# Patient Record
Sex: Female | Born: 1965
Health system: Southern US, Community
[De-identification: ages and names within clinical notes are randomized; demographics above are authoritative.]

## PROBLEM LIST (undated history)

## (undated) DIAGNOSIS — N83209 Unspecified ovarian cyst, unspecified side: Secondary | ICD-10-CM

## (undated) DIAGNOSIS — G4730 Sleep apnea, unspecified: Secondary | ICD-10-CM

## (undated) HISTORY — DX: Sleep apnea, unspecified: G47.30

## (undated) HISTORY — PX: ABDOMINAL HYSTERECTOMY: SHX81

## (undated) HISTORY — PX: FRACTURE SURGERY: SHX138

---

## 1998-11-20 ENCOUNTER — Other Ambulatory Visit: Admission: RE | Admit: 1998-11-20 | Discharge: 1998-11-20 | Payer: Self-pay | Admitting: Obstetrics & Gynecology

## 1999-10-29 ENCOUNTER — Other Ambulatory Visit: Admission: RE | Admit: 1999-10-29 | Discharge: 1999-10-29 | Payer: Self-pay | Admitting: Obstetrics & Gynecology

## 2000-05-27 ENCOUNTER — Inpatient Hospital Stay (HOSPITAL_COMMUNITY): Admission: AD | Admit: 2000-05-27 | Discharge: 2000-05-29 | Payer: Self-pay | Admitting: Obstetrics & Gynecology

## 2000-06-02 ENCOUNTER — Encounter: Admission: RE | Admit: 2000-06-02 | Discharge: 2000-08-31 | Payer: Self-pay | Admitting: Obstetrics & Gynecology

## 2000-07-13 ENCOUNTER — Other Ambulatory Visit: Admission: RE | Admit: 2000-07-13 | Discharge: 2000-07-13 | Payer: Self-pay | Admitting: Obstetrics & Gynecology

## 2001-07-28 ENCOUNTER — Other Ambulatory Visit: Admission: RE | Admit: 2001-07-28 | Discharge: 2001-07-28 | Payer: Self-pay | Admitting: Obstetrics & Gynecology

## 2002-05-11 ENCOUNTER — Ambulatory Visit (HOSPITAL_COMMUNITY): Admission: RE | Admit: 2002-05-11 | Discharge: 2002-05-11 | Payer: Self-pay | Admitting: Obstetrics & Gynecology

## 2002-05-11 ENCOUNTER — Encounter: Payer: Self-pay | Admitting: Obstetrics & Gynecology

## 2002-06-05 ENCOUNTER — Inpatient Hospital Stay (HOSPITAL_COMMUNITY): Admission: AD | Admit: 2002-06-05 | Discharge: 2002-06-08 | Payer: Self-pay | Admitting: Obstetrics & Gynecology

## 2002-06-10 ENCOUNTER — Encounter: Admission: RE | Admit: 2002-06-10 | Discharge: 2002-07-10 | Payer: Self-pay | Admitting: Obstetrics & Gynecology

## 2002-07-20 ENCOUNTER — Other Ambulatory Visit: Admission: RE | Admit: 2002-07-20 | Discharge: 2002-07-20 | Payer: Self-pay | Admitting: Obstetrics & Gynecology

## 2003-08-29 ENCOUNTER — Other Ambulatory Visit: Admission: RE | Admit: 2003-08-29 | Discharge: 2003-08-29 | Payer: Self-pay | Admitting: Obstetrics & Gynecology

## 2003-12-17 ENCOUNTER — Other Ambulatory Visit: Admission: RE | Admit: 2003-12-17 | Discharge: 2003-12-17 | Payer: Self-pay | Admitting: Obstetrics & Gynecology

## 2006-02-24 ENCOUNTER — Other Ambulatory Visit: Admission: RE | Admit: 2006-02-24 | Discharge: 2006-02-24 | Payer: Self-pay | Admitting: Obstetrics & Gynecology

## 2011-01-23 ENCOUNTER — Encounter: Payer: Self-pay | Admitting: Sports Medicine

## 2011-01-23 ENCOUNTER — Ambulatory Visit (INDEPENDENT_AMBULATORY_CARE_PROVIDER_SITE_OTHER): Payer: 59 | Admitting: Sports Medicine

## 2011-01-23 DIAGNOSIS — M25569 Pain in unspecified knee: Secondary | ICD-10-CM

## 2011-01-23 DIAGNOSIS — Q667 Congenital pes cavus, unspecified foot: Secondary | ICD-10-CM | POA: Insufficient documentation

## 2011-01-23 DIAGNOSIS — M21619 Bunion of unspecified foot: Secondary | ICD-10-CM

## 2011-01-28 NOTE — Assessment & Plan Note (Signed)
SummaryVirgina Organ RT [facility] [phone]   Vital Signs:  Patient profile:   45 year old female Height:      67 inches Weight:      169 pounds BMI:     26.56 BP sitting:   122 / 80  Vitals Entered By: Lillia Pauls CMA (January 23, 2011 2:10 PM) CC: rt knee pain since 08/2010   CC:  rt knee pain since 08/2010.  History of Present Illness: Pt presents to clinic for evaluation of rt anterior knee pain that she has experienced for the most part of the past yr.   Saw Eulah Pont and Thurston Hole 08/2010 dx with chondromylacia on rt knee by MRI.  Has pain with prolonged bending of knee with seated position, running, and tennis.  Has seen [facility] Distance had e stimulation for swelling- this was helpful.  Plays tennis 4 times per week, and runs 3 times per week.  no locking or giving way has been getting swelling w running and sports  bunions for a long time but no TX for these and they are not painful     Preventive Screening-Counseling & Management  Alcohol-Tobacco     Smoking Status: never  Social History: Smoking Status:  never  Physical Exam  General:  Well-developed,well-nourished,in no acute distress; alert,appropriate and cooperative throughout examination Msk:  Bilat crepitation R>L Leg lengths equal Full flexion and extension rt knee, nl rotation, ligaments stable Full flexion and extension lt knee, nl rotation, ligaments stable  rt quad weaker than lt  Good hip abduction rt and lt  Bilat bunions High arch bilat Good posterior tib function    Impression & Recommendations:  Problem # 1:  KNEE PAIN, RIGHT (ICD-719.46) she does appear to have significant chondromalacia note MRI showed significant cartilage loss recurrent swelling significant crepitation  given patellar tendon strap begin HEP ice for eswelling  more consistent NSAID use add some biking  reck 1 mo  Problem # 2:  BUNION (ICD-727.1) I think she needs custom orthotics  make these on RTC and before  nay running or tennis  Problem # 3:  TALIPES CAVUS (ICD-754.71) needs good arch support in all shoes  Patient Instructions: 1)  Wear knee strap with running, ok to wear brace with tennis  2)  biking 2 to 3 times per week - not heavy gears/ not too many hills 3)  OK to do ellitiptical on other days for workout 4)  start daily quad exercises  5)  Isometric 6)  straight leg lift 7)  lying leg extension on towel 8)  work up to 3 x 30 9)  then we will add 2 lb ankle weight and work up to 3 sets of 15 10)  ice toward end of day 11)  aleve 1 or 2 daily   Orders Added: 1)  New Patient Level III [99203]  Appended Document: NP,RUNNERS RT [facility] [phone]

## 2014-11-17 HISTORY — PX: OTHER SURGICAL HISTORY: SHX169

## 2015-02-26 HISTORY — PX: WRIST FRACTURE SURGERY: SHX121

## 2015-08-01 ENCOUNTER — Other Ambulatory Visit: Payer: Self-pay | Admitting: Obstetrics & Gynecology

## 2015-08-01 DIAGNOSIS — N926 Irregular menstruation, unspecified: Secondary | ICD-10-CM

## 2015-08-02 ENCOUNTER — Ambulatory Visit
Admission: RE | Admit: 2015-08-02 | Discharge: 2015-08-02 | Disposition: A | Discharge status: Home / Self Care | Payer: BLUE CROSS/BLUE SHIELD | Source: Ambulatory Visit | Attending: Obstetrics & Gynecology | Admitting: Obstetrics & Gynecology

## 2015-08-02 DIAGNOSIS — N926 Irregular menstruation, unspecified: Secondary | ICD-10-CM

## 2015-08-02 MED ORDER — IOPAMIDOL (ISOVUE-300) INJECTION 61%
100.0000 mL | Freq: Once | INTRAVENOUS | Status: AC | PRN
  Administered 2015-08-02: 100 mL via INTRAVENOUS

## 2015-08-10 ENCOUNTER — Ambulatory Visit: Payer: BLUE CROSS/BLUE SHIELD | Attending: Gynecologic Oncology | Admitting: Gynecologic Oncology

## 2015-08-10 ENCOUNTER — Encounter: Payer: Self-pay | Admitting: Gynecologic Oncology

## 2015-08-10 VITALS — BP 118/80 | HR 88 | Temp 99.2°F | Resp 18 | Ht 67.0 in | Wt 238.5 lb

## 2015-08-10 DIAGNOSIS — N8320 Unspecified ovarian cysts: Secondary | ICD-10-CM | POA: Diagnosis not present

## 2015-08-10 DIAGNOSIS — N939 Abnormal uterine and vaginal bleeding, unspecified: Secondary | ICD-10-CM | POA: Diagnosis not present

## 2015-08-10 DIAGNOSIS — N930 Postcoital and contact bleeding: Secondary | ICD-10-CM

## 2015-08-10 DIAGNOSIS — N83201 Unspecified ovarian cyst, right side: Secondary | ICD-10-CM

## 2015-08-10 NOTE — Patient Instructions (Signed)
Preparing for your Surgery  Plan for surgery on October 18 with Dr. Everitt Evangelyne.  Pre-operative Testing -You will receive a phone call from presurgical testing at [facility] to arrange for a pre-operative testing appointment before your surgery.  This appointment normally occurs one to two weeks before your scheduled surgery.   -Bring your insurance card, copy of an advanced directive if applicable, medication list  -At that visit, you will be asked to sign a consent for a possible blood transfusion in case a transfusion becomes necessary during surgery.  The need for a blood transfusion is rare but having consent is a necessary part of your care.     -You should not be taking blood thinners or aspirin at least ten days prior to surgery unless instructed by your surgeon.  Day Before Surgery at [place] will be asked to take in only clear liquids the day before surgery.  Examples of clear liquids include broths, jello, and clear juices.  Avoid carbonated beverages.  You will be advised to have nothing to eat or drink after midnight the evening before.    Your role in recovery Your role is to become active as soon as directed by your doctor, while still giving yourself time to heal.  Rest when you feel tired. You will be asked to do the following in order to speed your recovery:  - Cough and breathe deeply. This helps toclear and expand your lungs and can prevent pneumonia. You may be given a spirometer to practice deep breathing. A staff member will show you how to use the spirometer. - Do mild physical activity. Walking or moving your legs help your circulation and body functions return to normal. A staff member will help you when you try to walk and will provide you with simple exercises. Do not try to get up or walk alone the first time. - Actively manage your pain. Managing your pain lets you move in comfort. We will ask you to rate your pain on a scale of zero to 10.  It is your responsibility to tell your doctor or nurse where and how much you hurt so your pain can be treated.  Special Considerations -If you are diabetic, you may be placed on insulin after surgery to have closer control over your blood sugars to promote healing and recovery.  This does not mean that you will be discharged on insulin.  If applicable, your oral antidiabetics will be resumed when you are tolerating a solid diet.  -Your final pathology results from surgery should be available by the Friday after surgery and the results will be relayed to you when available.  Blood Transfusion Information WHAT IS A BLOOD TRANSFUSION? A transfusion is the replacement of blood or some of its parts. Blood is made up of multiple cells which provide different functions.  Red blood cells carry oxygen and are used for blood loss replacement.  White blood cells fight against infection.  Platelets control bleeding.  Plasma helps clot blood.  Other blood products are available for specialized needs, such as hemophilia or other clotting disorders. BEFORE THE TRANSFUSION  Who gives blood for transfusions?   You may be able to donate blood to be used at a later date on yourself (autologous donation).  Relatives can be asked to donate blood. This is generally not any safer than if you have received blood from a stranger. The same precautions are taken to ensure safety when a relative's blood is donated.  Healthy volunteers who are fully evaluated to make sure their blood is safe. This is blood bank blood. Transfusion therapy is the safest it has ever been in the practice of medicine. Before blood is taken from a donor, a complete history is taken to make sure that person has no history of diseases nor engages in risky social behavior (examples are intravenous drug use or sexual activity with multiple partners). The donor's travel history is screened to minimize risk of transmitting infections, such as  malaria. The donated blood is tested for signs of infectious diseases, such as HIV and hepatitis. The blood is then tested to be sure it is compatible with you in order to minimize the chance of a transfusion reaction. If you or a relative donates blood, this is often done in anticipation of surgery and is not appropriate for emergency situations. It takes many days to process the donated blood. RISKS AND COMPLICATIONS Although transfusion therapy is very safe and saves many lives, the main dangers of transfusion include:   Getting an infectious disease.  Developing a transfusion reaction. This is an allergic reaction to something in the blood you were given. Every precaution is taken to prevent this. The decision to have a blood transfusion has been considered carefully by your caregiver before blood is given. Blood is not given unless the benefits outweigh the risks.

## 2015-08-10 NOTE — Progress Notes (Signed)
Consult Note: Gyn-Onc  Consult was requested by Dr. Nori Riis for the evaluation of Jenna Pierce 49 y.o. female with a 4cm complex right adnexal mass, enlarged uterus and abnormal uterine bleeding.  CC:  Chief Complaint  Patient presents with  . right ovarian cyst    New consult    Assessment/Plan:  Jenna Pierce  is a 49 y.o.  year old obese woman with an enlarged (11cm) fibroid uterus with abnormal uterine bleeding refractory to medical therapy and a 4cm right ovarian complex cyst with papillary features and increased flow. Her CA 125 is normal at 21. I discussed with Jessic that this mass is most likely a benign or borderline process, however, I recommend its surgical removal and testing with frozen section. If malignancy is identified, she would require a complete hysterectomy, BSO and staging. I discussed that if it is a benign mass, we would leave her normal appearing left ovary, but remove its tube. We discussed hysterectomy. She would prefer that we do this with her oophorectomy as she is not interested in trying again an IUD or trying endometrial ablation. She understands that hysterectomy adds increased risk and recovery to the procedure and she may require a minilaparotomy for specimen delivery.  I discussed surgical risks including  bleeding, infection, damage to internal organs (such as bladder,ureters, bowels), blood clot, reoperation and rehospitalization. I discussed that I though that she was a good candidate for a robotic assisted TLH, RSO and frozen section. I discussed that her obesity places her at increased risks for complications. We discussed that hysterectomy would result in permanent infertility.  Followup today's biopsy regarding endometrial pathololgy   HPI: Jenna Pierce is a 49 year old woman who is seen in consultation at the request of Dr Nori Riis for a complex right ovarian cyst. The patient has been struggling with symptoms of abnormal uterine bleeding for several years. She  has tried IUD. As part of her workup to a potential hysterctomy, she received a TV US  On 08/01/15 Which showed a 4.4c3.3c3.2cm ovarian mass with papillary projectsion that have blood flow within them. Endometrial stripe was 59mm. The CT abdo/pelvis on 08/02/15 showed no evidence of metastatic disease or ascites.  CA 125 was drawn on 08/01/15 and was normal at 21.  She has had 2 prior SVD's and no prior surgeries on her abdomen.   Current Meds:  Outpatient Encounter Prescriptions as of 08/10/2015  Medication Sig  . ALPRAZolam (XANAX) 0.5 MG tablet TAKE 1 TABLET 3 TIMES A DAY AS NEEDED   No facility-administered encounter medications on file as of 08/10/2015.    Allergy: No Known Allergies  Social Hx:   Social History   Social History  . Marital Status: Single    Spouse Name: N/A  . Number of Children: N/A  . Years of Education: N/A   Occupational History  . Not on file.   Social History Main Topics  . Smoking status: Never Smoker   . Smokeless tobacco: Not on file  . Alcohol Use: Yes     Comment: 1 drink daily with dinner  . Drug Use: No  . Sexual Activity: Not on file   Other Topics Concern  . Not on file   Social History Narrative  . No narrative on file    Past Surgical Hx:  Past Surgical History  Procedure Laterality Date  . Wrist fracture surgery Left 02/26/2015    hardware and screws placed    Past Medical Hx: History reviewed.  No pertinent past medical history.  Past Gynecological History:   Patient's last menstrual period was 07/30/2015.  Family Hx:  Family History  Problem Relation Age of Onset  . Diabetes Father   . Breast cancer Paternal Grandmother     Review of Systems:  Constitutional  Feels well,    ENT Normal appearing ears and nares bilaterally Skin/Breast  No rash, sores, jaundice, itching, dryness Cardiovascular  No chest pain, shortness of breath, or edema  Pulmonary  No cough or wheeze.  Gastro Intestinal  No nausea, vomitting,  or diarrhoea. No bright red blood per rectum, no abdominal pain, change in bowel movement, or constipation.  Genito Urinary  No frequency, urgency, dysuria, see HPI Musculo Skeletal  No myalgia, arthralgia, joint swelling or pain  Neurologic  No weakness, numbness, change in gait,  Psychology  No depression, anxiety, insomnia.   Vitals:  Blood pressure 118/80, pulse 88, temperature 99.2 F (37.3 C), temperature source Oral, resp. rate 18, height 5\' 7"  (1.702 m), weight 238 lb 8 oz (108.183 kg), last menstrual period 07/30/2015, SpO2 99 %.  Physical Exam: WD in NAD Neck  Supple NROM, without any enlargements.  Lymph Node Survey No cervical supraclavicular or inguinal adenopathy Cardiovascular  Pulse normal rate, regularity and rhythm. S1 and S2 normal.  Lungs  Clear to auscultation bilateraly, without wheezes/crackles/rhonchi. Good air movement.  Skin  No rash/lesions/breakdown  Psychiatry  Alert and oriented to person, place, and time  Abdomen  Normoactive bowel sounds, abdomen soft, non-tender and obese without evidence of hernia.  Back No CVA tenderness Genito Urinary  Vulva/vagina: Normal external female genitalia.  No lesions. No discharge or bleeding.  Bladder/urethra:  No lesions or masses, well supported bladder  Vagina: normal  Cervix: Normal appearing, no lesions.  Uterus:  Bulky, 10 week size, mobile, no parametrial involvement or nodularity.  Adnexa: no palpable masses.  PROCEDURE NOTE:  Endometrial Biopsy  The procedure was explained.  The speculum was placed and the cervix was prepped with betadine.  The endometrial biopsy was performed with 2 passes of the endometrial biopsy pipelle. scant tissue was obtained. The uterus sounded to 11 cm.  The patient tolerated procedure    Rectal  Good tone, no masses no cul de sac nodularity.  Extremities  No bilateral cyanosis, clubbing or edema.   [name], MD  08/10/2015, 6:06 PM

## 2015-08-14 ENCOUNTER — Telehealth: Payer: Self-pay | Admitting: *Deleted

## 2015-08-14 NOTE — Telephone Encounter (Signed)
Per patient request, she would like to have surgery at [facility] sooner. Pt scheduled 08/24/15 at [facility] with Dr. Alycia Rossetti. Records faxed to [facility] and she is agreeable to call patient with pre-op appt. Called and notified patient of surgery date and that she will receive phone call from [facility]. No other questions or concerns noted at this time.

## 2015-08-14 NOTE — Telephone Encounter (Signed)
Per Joylene John, NP patient notified that endometrial biopsy did not show any cancer. Patient states understanding and no other concerns noted at this time.

## 2015-08-20 ENCOUNTER — Emergency Department (HOSPITAL_COMMUNITY)
Admission: EM | Admit: 2015-08-20 | Discharge: 2015-08-20 | Disposition: A | Discharge status: Home / Self Care | Payer: BLUE CROSS/BLUE SHIELD | Attending: Emergency Medicine | Admitting: Emergency Medicine

## 2015-08-20 ENCOUNTER — Encounter (HOSPITAL_COMMUNITY): Payer: Self-pay | Admitting: Emergency Medicine

## 2015-08-20 DIAGNOSIS — R109 Unspecified abdominal pain: Secondary | ICD-10-CM

## 2015-08-20 DIAGNOSIS — Z8742 Personal history of other diseases of the female genital tract: Secondary | ICD-10-CM | POA: Diagnosis not present

## 2015-08-20 DIAGNOSIS — R1032 Left lower quadrant pain: Secondary | ICD-10-CM | POA: Diagnosis not present

## 2015-08-20 DIAGNOSIS — R1030 Lower abdominal pain, unspecified: Secondary | ICD-10-CM | POA: Diagnosis not present

## 2015-08-20 DIAGNOSIS — Z3202 Encounter for pregnancy test, result negative: Secondary | ICD-10-CM | POA: Insufficient documentation

## 2015-08-20 DIAGNOSIS — R1031 Right lower quadrant pain: Secondary | ICD-10-CM | POA: Diagnosis not present

## 2015-08-20 DIAGNOSIS — R1033 Periumbilical pain: Secondary | ICD-10-CM | POA: Insufficient documentation

## 2015-08-20 HISTORY — DX: Unspecified ovarian cyst, unspecified side: N83.209

## 2015-08-20 LAB — CBC
HCT: 36.6 % (ref 36.0–46.0)
HEMOGLOBIN: 12.4 g/dL (ref 12.0–15.0)
MCH: 30.1 pg (ref 26.0–34.0)
MCHC: 33.9 g/dL (ref 30.0–36.0)
MCV: 88.8 fL (ref 78.0–100.0)
PLATELETS: 238 10*3/uL (ref 150–400)
RBC: 4.12 MIL/uL (ref 3.87–5.11)
RDW: 12.8 % (ref 11.5–15.5)
WBC: 12.9 10*3/uL — ABNORMAL HIGH (ref 4.0–10.5)

## 2015-08-20 LAB — COMPREHENSIVE METABOLIC PANEL
ALK PHOS: 78 U/L (ref 38–126)
ALT: 14 U/L (ref 14–54)
ANION GAP: 8 (ref 5–15)
AST: 22 U/L (ref 15–41)
Albumin: 3.9 g/dL (ref 3.5–5.0)
BILIRUBIN TOTAL: 0.5 mg/dL (ref 0.3–1.2)
BUN: 11 mg/dL (ref 6–20)
CALCIUM: 9.8 mg/dL (ref 8.9–10.3)
CO2: 24 mmol/L (ref 22–32)
Chloride: 105 mmol/L (ref 101–111)
Creatinine, Ser: 0.77 mg/dL (ref 0.44–1.00)
Glucose, Bld: 123 mg/dL — ABNORMAL HIGH (ref 65–99)
Potassium: 3.6 mmol/L (ref 3.5–5.1)
SODIUM: 137 mmol/L (ref 135–145)
TOTAL PROTEIN: 7.4 g/dL (ref 6.5–8.1)

## 2015-08-20 LAB — URINALYSIS, ROUTINE W REFLEX MICROSCOPIC
Bilirubin Urine: NEGATIVE
GLUCOSE, UA: NEGATIVE mg/dL
KETONES UR: NEGATIVE mg/dL
LEUKOCYTES UA: NEGATIVE
Nitrite: NEGATIVE
PROTEIN: NEGATIVE mg/dL
Specific Gravity, Urine: 1.01 (ref 1.005–1.030)
UROBILINOGEN UA: 0.2 mg/dL (ref 0.0–1.0)
pH: 5.5 (ref 5.0–8.0)

## 2015-08-20 LAB — URINE MICROSCOPIC-ADD ON

## 2015-08-20 LAB — POC URINE PREG, ED: PREG TEST UR: NEGATIVE

## 2015-08-20 LAB — LIPASE, BLOOD: Lipase: 28 U/L (ref 22–51)

## 2015-08-20 MED ORDER — MORPHINE SULFATE (PF) 4 MG/ML IV SOLN
4.0000 mg | Freq: Once | INTRAVENOUS | Status: AC
  Administered 2015-08-20: 4 mg via INTRAVENOUS
  Filled 2015-08-20: qty 1

## 2015-08-20 MED ORDER — SODIUM CHLORIDE 0.9 % IV BOLUS (SEPSIS)
1000.0000 mL | Freq: Once | INTRAVENOUS | Status: AC
  Administered 2015-08-20: 1000 mL via INTRAVENOUS

## 2015-08-20 MED ORDER — ONDANSETRON HCL 4 MG/2ML IJ SOLN
4.0000 mg | Freq: Once | INTRAMUSCULAR | Status: AC
  Administered 2015-08-20: 4 mg via INTRAVENOUS
  Filled 2015-08-20: qty 2

## 2015-08-20 MED ORDER — OXYCODONE-ACETAMINOPHEN 5-325 MG PO TABS: 1.0000 | ORAL_TABLET | ORAL | Status: DC | PRN

## 2015-08-20 MED ORDER — ONDANSETRON 4 MG PO TBDP: 4.0000 mg | ORAL_TABLET | Freq: Three times a day (TID) | ORAL | Status: DC | PRN

## 2015-08-20 NOTE — ED Provider Notes (Signed)
CSN: [id number]     Arrival date & time 08/20/15  0006 History   First MD Initiated Contact with Patient 08/20/15 0036     Chief Complaint  Patient presents with  . Abdominal Pain  . Ovarian Cyst     (Consider location/radiation/quality/duration/timing/severity/associated sxs/prior Treatment) Patient is a 49 y.o. female presenting with abdominal pain. The history is provided by the patient and medical records.  Abdominal Pain  49 year old female with history of ovarian cyst and uterine fibroids, presenting to the ED for abdominal pain. Patient is currently seeing GYN oncology for mass of her right ovary as well as fibroid uterus. She has had recent ultrasound and CT scan which are largely unchanged from prior.  She also recently underwent endometrial bx which was negative for cancer.  She is scheduled for right nephrectomy on 08/24/2015. States she has pain at baseline, however became worse last night. Of note, she also started her menstrual cycle yesterday.  Pain described as cramping throughout her lower abdomen.  She denies any nausea, vomiting, or diarrhea. No urinary symptoms or vaginal discharge.  She admits to decreased oral intake due to pain. She reports low-grade fever earlier today of 100F noted on temp oral thermometer, patient afebrile here. She is taken Tylenol prior to arrival with some relief of pain.  Past Medical History  Diagnosis Date  . Ovarian cyst    Past Surgical History  Procedure Laterality Date  . Wrist fracture surgery Left 02/26/2015    hardware and screws placed   Family History  Problem Relation Age of Onset  . Diabetes Father   . Breast cancer Paternal Grandmother    Social History  Substance Use Topics  . Smoking status: Never Smoker   . Smokeless tobacco: None  . Alcohol Use: Yes     Comment: 1 drink daily with dinner   OB History    No data available     Review of Systems  Gastrointestinal: Positive for abdominal pain.  All other systems  reviewed and are negative.     Allergies  Review of patient's allergies indicates no known allergies.  Home Medications   Prior to Admission medications   Medication Sig Start Date End Date Taking? Authorizing Provider  ALPRAZolam (XANAX) 0.5 MG tablet TAKE 1 TABLET 3 TIMES A DAY AS NEEDED for anxiety 08/06/15  Yes Historical Provider, MD   BP 161/95 mmHg  Pulse 98  Temp(Src) 98.5 F (36.9 C) (Oral)  Resp 16  Ht 5\' 7"  (1.702 m)  Wt 238 lb (107.956 kg)  BMI 37.27 kg/m2  SpO2 98%  LMP 08/19/2015 (Exact Date)   Physical Exam  Constitutional: She is oriented to person, place, and time. She appears well-developed and well-nourished.  HENT:  Head: Normocephalic and atraumatic.  Mouth/Throat: Oropharynx is clear and moist.  Eyes: Conjunctivae and EOM are normal. Pupils are equal, round, and reactive to light.  Neck: Normal range of motion. Neck supple.  Cardiovascular: Normal rate, regular rhythm and normal heart sounds.   Pulmonary/Chest: Effort normal and breath sounds normal. No respiratory distress. She has no wheezes.  Abdominal: Soft. Bowel sounds are normal. There is tenderness in the right lower quadrant, periumbilical area, suprapubic area and left lower quadrant. There is no rigidity, no guarding, no CVA tenderness, no tenderness at McBurney's point and negative Murphy's sign.  Mild tenderness of lower abdomen and periumbilical region with endorsed cramping sensation, normal bowel sounds, no peritonitis  Musculoskeletal: Normal range of motion.  Neurological: She is alert and  oriented to person, place, and time.  Skin: Skin is warm and dry.  Psychiatric: She has a normal mood and affect.  Nursing note and vitals reviewed.   ED Course  Procedures (including critical care time) Labs Review Labs Reviewed  COMPREHENSIVE METABOLIC PANEL - Abnormal; Notable for the following:    Glucose, Bld 123 (*)    All other components within normal limits  CBC - Abnormal; Notable  for the following:    WBC 12.9 (*)    All other components within normal limits  URINALYSIS, ROUTINE W REFLEX MICROSCOPIC (NOT AT [facility]) - Abnormal; Notable for the following:    APPearance CLOUDY (*)    Hgb urine dipstick LARGE (*)    All other components within normal limits  LIPASE, BLOOD  URINE MICROSCOPIC-ADD ON  POC URINE PREG, ED    Imaging Review No results found. I have personally reviewed and evaluated these images and lab results as part of my medical decision-making.   EKG Interpretation None      MDM   Final diagnoses:  Abdominal pain, unspecified abdominal location   49 year old female here with abdominal pain. Long-standing history of ovarian cysts and is scheduled for surgery on 08/24/2015 for this. Patient reports fever at home by temporal thermometer, afebrile here.  Has some tenderness across lower abdomen and periumbilical region without peritonitis. Labwork was obtained, slight leukocytosis noted-- no prior values available for comparison. UA is noninfectious-- blood noted which is likely due to menstrual. Patient was treated with IV fluids, morphine, and Zofran with significant improvement of her pain.  She may have increased pain secondary to onset of menses yesterday.  Patient has had recent CT scan and transvaginal ultrasound within the past 2 weeks as well as recently endometrial bx.  Will hold off on repeat imaging at this time.  Patient has previously scheduled follow-up tomorrow with OB-GYN in [place] for pre-op appt, recommended to discuss ED visit with physician.  Rx percocet, zofran.  Discussed plan with patient, he/she acknowledged understanding and agreed with plan of care.  Return precautions given for new or worsening symptoms.  Larene Pickett, PA-C 08/20/15 [id number]  April Palumbo, MD 08/20/15 [phone]

## 2015-08-20 NOTE — ED Notes (Signed)
Patient is resting comfortably. 

## 2015-08-20 NOTE — ED Notes (Signed)
Pt presents from home c/o lower abdominal pain rated 7/10 due to ovarian cysts.  She is scheduled for removal of her right ovary at the end of the week.  She has had numerous diagnostic tests performed and after the ovarian removal they will be doing a biopsy to evaluate for potential CA.  She started her period last night. Fever 100F at home, 98.5 in triage after taking 2 tylenol prior to arrival in ED. No acute distress.

## 2015-08-20 NOTE — Discharge Instructions (Signed)
Take the prescribed medication as directed. Follow-up at pre-op appt tomorrow at [facility]. Return to the ED for new or worsening symptoms.

## 2015-08-24 HISTORY — PX: ROBOTIC ASSISTED TOTAL HYSTERECTOMY WITH BILATERAL SALPINGO OOPHERECTOMY: SHX6086

## 2015-08-30 ENCOUNTER — Telehealth: Payer: Self-pay | Admitting: *Deleted

## 2015-08-30 NOTE — Telephone Encounter (Signed)
Received phone call from patient requesting to schedule post-op appt in [place]. Patient had surgery at [facility] on 08/24/15 with Dr. Alycia Rossetti. Patient scheduled for 10-31 with Dr. Denman George (as Dr. Elenora Gamma next clinic day is 10/17/15). Patient denies any post-op concerns at this time. Instructed patient to call our office with any additional questions or concerns prior to scheduled appt.

## 2015-09-17 ENCOUNTER — Encounter: Payer: Self-pay | Admitting: Gynecologic Oncology

## 2015-09-17 ENCOUNTER — Ambulatory Visit: Payer: BLUE CROSS/BLUE SHIELD | Attending: Gynecologic Oncology | Admitting: Gynecologic Oncology

## 2015-09-17 VITALS — BP 129/87 | HR 79 | Temp 98.0°F | Resp 20 | Ht 67.0 in | Wt 233.2 lb

## 2015-09-17 DIAGNOSIS — N809 Endometriosis, unspecified: Secondary | ICD-10-CM

## 2015-09-17 DIAGNOSIS — N939 Abnormal uterine and vaginal bleeding, unspecified: Secondary | ICD-10-CM | POA: Diagnosis not present

## 2015-09-17 DIAGNOSIS — Z9889 Other specified postprocedural states: Secondary | ICD-10-CM | POA: Diagnosis not present

## 2015-09-17 DIAGNOSIS — N83201 Unspecified ovarian cyst, right side: Secondary | ICD-10-CM | POA: Diagnosis not present

## 2015-09-17 DIAGNOSIS — N800 Endometriosis of uterus: Secondary | ICD-10-CM

## 2015-09-17 NOTE — Patient Instructions (Signed)
Plan to follow up with your GYN for well woman care annually.  Please call the office for any questions or concerns.  Hold off on having intercourse until the light pink discharge resolves.

## 2015-09-17 NOTE — Progress Notes (Signed)
POSTOPERATIVE FOLLOWUP  Assessment:    49 y.o. year old with a history of stage IV endometriosis and oophoritis.   S/p robotic assisted total hysterectomy, BSO and lysis of adhesions on 08/24/15.   Plan: 1) Pathology reports reviewed today 2) Treatment counseling - I discussed that she has benign disease and does not require specific followup for this.  I discussed that she should consider taking estrogen replacement therapy (0.025 q weekly transdermal) until age 68-51 or age of natural menopause for CV/bone health. She was given the opportunity to ask questions, which were answered to her satisfaction, and she is agreement with the above mentioned plan of care.  3) vaginal cuff bleeding - appears consistent with postop healing. Recommend no intercourse until no further bleeding or until 6 weeks.  4)  Return to clinic on a prn basis. I recommended she followup with Dr Nori Riis annually for well woman care.  HPI:  Jenna Pierce is a 50 y.o. year old No obstetric history on file. initially seen in consultation on 07/10/15 for a pelvic mass and abnormal uterine bleeding.  She then underwent a robotic assisted hysterectomy, BSO and lysis of adhesions with my partner, Dr Alycia Rossetti, on 08/24/15 at [facility] without complications, though she had findings of stage IV endometriosis, severe pelvic adhesive disease including obliteration of the posterior cul de sac and adhesions between colon and uterus.  Her postoperative course was uncomplicated.  Her final pathology revealed endometriosis, adenomyosis and signs of abscess and infection in the tubes and ovaries.  She is seen today for a postoperative check and to discuss her pathology results and ongoing plan.  Since discharge from the hospital, she is feeling well and increasingly well each day. She was prescribed 0.05mg  estradiol transdermal q weekly postop.  She has improving appetite, normal bowel and bladder function, and pain controlled with minimal PO  medication. She has no other complaints today.    Review of systems: Constitutional:  She has no weight gain or weight loss. She has no fever or chills. Eyes: No blurred vision Ears, Nose, Mouth, Throat: No dizziness, headaches or changes in hearing. No mouth sores. Gastrointestinal: She has normal bowel movements without diarrhea or constipation. She denies any nausea or vomiting. She denies blood in her stool or heart burn. Genitourinary:  She denies pelvic pain, pelvic pressure or changes in her urinary function. She has no hematuria, dysuria, or incontinence. She has no irregular vaginal bleeding or vaginal discharge Musculoskeletal: Denies muscle weakness or joint pains.  Skin:  She has no skin changes, rashes or itching Neurological:  Denies dizziness or headaches. No neuropathy, no numbness or tingling. Psychiatric:  She denies depression or anxiety. Hematologic/Lymphatic:   No easy bruising or bleeding   Physical Exam: Blood pressure 129/87, pulse 79, temperature 98 F (36.7 C), temperature source Oral, resp. rate 20, height 5\' 7"  (1.702 m), weight 233 lb 3.2 oz (105.779 kg), last menstrual period 08/19/2015, SpO2 99 %. General: Well dressed, well nourished in no apparent distress.   HEENT:  Normocephalic and atraumatic, no lesions.  Extraocular muscles intact. Sclerae anicteric. Pupils equal, round, reactive. No mouth sores or ulcers. Thyroid is normal size, not nodular, midline. Abdomen:  Soft, nontender, nondistended.  No palpable masses.  No hepatosplenomegaly.  No ascites. Normal bowel sounds.  No hernias.  Incisions are well healed. Genitourinary: Normal EGBUS  Vaginal cuff intact.  Scan blood tinged fluid in vagina, no discharge.  No cul de sac fullness. Extremities: No cyanosis, clubbing  or edema.  No calf tenderness or erythema. No palpable cords. Psychiatric: Mood and affect are appropriate. Neurological: Awake, alert and oriented x 3. Sensation is intact, no neuropathy.   Musculoskeletal: No pain, normal strength and range of motion.   [name], MD

## 2015-11-18 HISTORY — PX: BUNIONECTOMY: SHX129

## 2015-11-18 HISTORY — PX: OTHER SURGICAL HISTORY: SHX169

## 2016-02-21 DIAGNOSIS — M79642 Pain in left hand: Secondary | ICD-10-CM | POA: Diagnosis not present

## 2016-02-21 DIAGNOSIS — S62398D Other fracture of other metacarpal bone, subsequent encounter for fracture with routine healing: Secondary | ICD-10-CM | POA: Diagnosis not present

## 2016-03-05 DIAGNOSIS — S62398D Other fracture of other metacarpal bone, subsequent encounter for fracture with routine healing: Secondary | ICD-10-CM | POA: Diagnosis not present

## 2016-03-06 DIAGNOSIS — S62398D Other fracture of other metacarpal bone, subsequent encounter for fracture with routine healing: Secondary | ICD-10-CM | POA: Diagnosis not present

## 2016-03-06 DIAGNOSIS — M79642 Pain in left hand: Secondary | ICD-10-CM | POA: Diagnosis not present

## 2016-04-10 DIAGNOSIS — H5213 Myopia, bilateral: Secondary | ICD-10-CM | POA: Diagnosis not present

## 2016-06-25 DIAGNOSIS — D1801 Hemangioma of skin and subcutaneous tissue: Secondary | ICD-10-CM | POA: Diagnosis not present

## 2016-06-25 DIAGNOSIS — D485 Neoplasm of uncertain behavior of skin: Secondary | ICD-10-CM | POA: Diagnosis not present

## 2016-07-02 ENCOUNTER — Ambulatory Visit (INDEPENDENT_AMBULATORY_CARE_PROVIDER_SITE_OTHER): Payer: BLUE CROSS/BLUE SHIELD | Admitting: Podiatry

## 2016-07-02 ENCOUNTER — Ambulatory Visit (INDEPENDENT_AMBULATORY_CARE_PROVIDER_SITE_OTHER): Payer: BLUE CROSS/BLUE SHIELD

## 2016-07-02 ENCOUNTER — Encounter: Payer: Self-pay | Admitting: Podiatry

## 2016-07-02 VITALS — BP 131/89 | HR 81 | Resp 16 | Ht 67.0 in | Wt 225.0 lb

## 2016-07-02 DIAGNOSIS — M7662 Achilles tendinitis, left leg: Secondary | ICD-10-CM

## 2016-07-02 DIAGNOSIS — M21619 Bunion of unspecified foot: Secondary | ICD-10-CM

## 2016-07-02 DIAGNOSIS — M7661 Achilles tendinitis, right leg: Secondary | ICD-10-CM | POA: Diagnosis not present

## 2016-07-02 MED ORDER — DICLOFENAC SODIUM 75 MG PO TBEC: 75.0000 mg | DELAYED_RELEASE_TABLET | Freq: Two times a day (BID) | ORAL | 2 refills | Status: DC

## 2016-07-02 MED ORDER — TRIAMCINOLONE ACETONIDE 10 MG/ML IJ SUSP: 10.0000 mg | Freq: Once | INTRAMUSCULAR | Status: DC

## 2016-07-02 NOTE — Progress Notes (Signed)
Subjective:     Patient ID: Jenna Pierce, female   DOB: 1965-12-25, 50 y.o.   MRN: [id number]  HPI patient presents stating I have painful bunion deformities of both feet long-term nature and I'm having a lot of pain in the back of my heels right over left that has been getting worse gradually as time goes on for the last few months   Review of Systems  All other systems reviewed and are negative.      Objective:   Physical Exam  Constitutional: She is oriented to person, place, and time.  Cardiovascular: Intact distal pulses.   Musculoskeletal: Normal range of motion.  Neurological: She is oriented to person, place, and time.  Skin: Skin is warm.  Nursing note and vitals reviewed.  neurovascular status found to be intact with muscle strength adequate range of motion within normal limits with patient found to have large structural bunion deformities bilateral with redness and pain around the medial aspect first metatarsal head with patient noted to have inflammation and pain around the lateral portion of the Achilles tendon insertion heel right over left. Patient's found have good digital perfusion is well oriented 3 with moderate depression of the arch     Assessment:     Inflammatory tendinitis posterior lateral heel with long-term structural bunion history with significant family history of condition    Plan:     H&P and x-rays reviewed and careful injection right posterior heel administered 3 mg dexamethasone Kenalog 5 mg Xylocaine after first explaining chances for rupture. I then dispensed air fracture walker that I want her to wear as much as possible and I discussed bunion condition which will require aggressive Austin-type osteotomy and she will reappoint for consult for this when we get the heel was better  X-ray report indicated structural bunion deformity bilateral with elevation of the intermetatarsal angle of approximately 15 and deviation of the hallux bilateral

## 2016-07-02 NOTE — Progress Notes (Signed)
   Subjective:    Patient ID: Jenna Pierce, female    DOB: [birth year], 50 y.o.   MRN: [id number]  HPI Chief Complaint  Patient presents with  . Foot Pain    Bilateral; bunions & bumps on back of heels; pt stated, "Bunions hurt when wears shoes; bumps on back of heel are causing excruciating pain"; x2 months      Review of Systems  Musculoskeletal: Positive for arthralgias.  All other systems reviewed and are negative.      Objective:   Physical Exam        Assessment & Plan:

## 2016-07-02 NOTE — Patient Instructions (Signed)

## 2016-07-17 ENCOUNTER — Ambulatory Visit (INDEPENDENT_AMBULATORY_CARE_PROVIDER_SITE_OTHER): Payer: BLUE CROSS/BLUE SHIELD | Admitting: Podiatry

## 2016-07-17 DIAGNOSIS — M7662 Achilles tendinitis, left leg: Secondary | ICD-10-CM

## 2016-07-17 DIAGNOSIS — M21619 Bunion of unspecified foot: Secondary | ICD-10-CM

## 2016-07-17 DIAGNOSIS — M722 Plantar fascial fibromatosis: Secondary | ICD-10-CM

## 2016-07-17 DIAGNOSIS — M7661 Achilles tendinitis, right leg: Secondary | ICD-10-CM

## 2016-07-17 MED ORDER — TRIAMCINOLONE ACETONIDE 10 MG/ML IJ SUSP
10.0000 mg | Freq: Once | INTRAMUSCULAR | Status: AC
  Administered 2016-07-17: 10 mg

## 2016-07-17 NOTE — Progress Notes (Signed)
Subjective:     Patient ID: Jenna Pierce, female   DOB: [birth year], 50 y.o.   MRN: [id number]  HPI patient states they seem little better and the right one has improved but I still have the pain in the left one I like that treated and also I'm ready to get my bunions fixed   Review of Systems     Objective:   Physical Exam Neurovascular status intact muscle strength adequate with patient found to have large hyperostosis medial aspect first metatarsal head right over left with redness around the bone and inflammation and mild discomfort posterior aspect heel left over right    Assessment:     Achilles tendinitis left over right with the right one having done better with previous injection and structural bunion deformity bilateral    Plan:     H&P conditions reviewed and allowed her to review correction of bunion which be done right foot first and at this time I allowed her to go over consent form reviewing alternative treatments complications and the fact that recovery will be 6 months to one year. She wants surgery signed consent form and today I did a careful injection left lateral side after explaining chances for rupture with 3 mg Dexon some Kenalog 5 mill grams Xylocaine advised on reduced activity and utilization of boot on the left  X-rays reviewed today indicated elevation 1 to metatarsal angle of about 16 on both feet

## 2016-07-17 NOTE — Patient Instructions (Signed)

## 2016-08-28 ENCOUNTER — Telehealth: Payer: Self-pay | Admitting: *Deleted

## 2016-08-28 NOTE — Telephone Encounter (Signed)
"  Patient's [employer] has been terminated."  I will give her a call.   I attempted to call patient.  I left her a message to call me back.  I need to get updated insurance information for upcoming appointment on October 24.

## 2016-09-01 NOTE — Telephone Encounter (Signed)
I left a message for patient to give me a call.  Need to verify insurance.

## 2016-09-03 ENCOUNTER — Ambulatory Visit (INDEPENDENT_AMBULATORY_CARE_PROVIDER_SITE_OTHER): Payer: BLUE CROSS/BLUE SHIELD | Admitting: Podiatry

## 2016-09-03 ENCOUNTER — Encounter: Payer: Self-pay | Admitting: Podiatry

## 2016-09-03 DIAGNOSIS — M21619 Bunion of unspecified foot: Secondary | ICD-10-CM

## 2016-09-03 DIAGNOSIS — M7662 Achilles tendinitis, left leg: Secondary | ICD-10-CM

## 2016-09-03 DIAGNOSIS — M7661 Achilles tendinitis, right leg: Secondary | ICD-10-CM

## 2016-09-03 NOTE — Patient Instructions (Signed)

## 2016-09-03 NOTE — Progress Notes (Signed)
Subjective:     Patient ID: Jenna Pierce, female   DOB: 05/10/1966, 50 y.o.   MRN: [id number]  HPI patient presents stating that she is ready to get the right bunion fixed and would like to do the left one 3 weeks later if possible. Also complains about the Achilles tendon bothering her more on the left than the right   Review of Systems     Objective:   Physical Exam Neurovascular status intact muscle strength adequate range of motion within normal limits with patient found to have inflammatory changes posterior aspect left heel and large structural bunion deformities bilateral that are red and painful when pressed with deviation the hallux bilateral    Assessment:     Structural HAV deformity bilateral with Achilles tendinitis left over right    Plan:     H&P x-rays reviewed and recommended at this time structural bunion correction right to be followed by left and allow patient to read consent form reviewing alternative treatments and complications. Patient wants surgery signs consent form and we reviewed all complications as listed and alternative treatments and patient wished and recovery will take approximate 6 months. We also may consider injection of the posterior Achilles and I did explain risk also associated with this and patient wants this to possibly be performed with decision to be made at time of surgery

## 2016-09-04 NOTE — Telephone Encounter (Signed)
Patient updated her insurance information when she came in for consultation on yesterday.

## 2016-09-09 ENCOUNTER — Encounter: Payer: Self-pay | Admitting: Podiatry

## 2016-09-09 DIAGNOSIS — M2011 Hallux valgus (acquired), right foot: Secondary | ICD-10-CM | POA: Diagnosis not present

## 2016-09-09 DIAGNOSIS — M21611 Bunion of right foot: Secondary | ICD-10-CM | POA: Diagnosis not present

## 2016-09-09 DIAGNOSIS — Z01818 Encounter for other preprocedural examination: Secondary | ICD-10-CM | POA: Diagnosis not present

## 2016-09-09 DIAGNOSIS — M722 Plantar fascial fibromatosis: Secondary | ICD-10-CM | POA: Diagnosis not present

## 2016-09-12 NOTE — Progress Notes (Signed)
DOS 10.24.2017 Austin Bunionectomy (cutting and moving bone) with Pin Fixation Right; Possible Injection Heels B/L Achilles

## 2016-09-19 ENCOUNTER — Ambulatory Visit (INDEPENDENT_AMBULATORY_CARE_PROVIDER_SITE_OTHER): Payer: BLUE CROSS/BLUE SHIELD

## 2016-09-19 ENCOUNTER — Ambulatory Visit (INDEPENDENT_AMBULATORY_CARE_PROVIDER_SITE_OTHER): Payer: BLUE CROSS/BLUE SHIELD | Admitting: Podiatry

## 2016-09-19 ENCOUNTER — Encounter: Payer: Self-pay | Admitting: Podiatry

## 2016-09-19 VITALS — Temp 98.1°F

## 2016-09-19 DIAGNOSIS — M21619 Bunion of unspecified foot: Secondary | ICD-10-CM | POA: Diagnosis not present

## 2016-09-19 DIAGNOSIS — M7662 Achilles tendinitis, left leg: Secondary | ICD-10-CM | POA: Diagnosis not present

## 2016-09-19 DIAGNOSIS — Z9889 Other specified postprocedural states: Secondary | ICD-10-CM | POA: Diagnosis not present

## 2016-09-19 DIAGNOSIS — M7661 Achilles tendinitis, right leg: Secondary | ICD-10-CM | POA: Diagnosis not present

## 2016-09-19 DIAGNOSIS — Z09 Encounter for follow-up examination after completed treatment for conditions other than malignant neoplasm: Secondary | ICD-10-CM

## 2016-09-19 NOTE — Progress Notes (Signed)
This patient presents to the office approximately 10 days following surgery for correction of a severe bunion big toe joint, right foot. She states that she is experiencing significant pain or discomfort noted at the surgical site bone removal of the bandage. There was significant bleeding noted on the bandage itself. There is also significant swelling noted on the dorsum of the first metatarsal of the right foot. Patient states states that she has been more active than she should and has been not taking her pain medicine after the first 2 days due to a nauseous numbness issue. She presents the office today wearing her cam walker but her husband admits she has not been faithfully wearing her cam walker since the surgery. She presents the office today for her first postoperative visit   Neurovascular status intact. Good wound coaptation and sutures are intact at the first metatarsal right foot. There is significant swelling noted on the dorsum of the first metatarsal of the right foot. No signs of redness or infection or streaking noted at this time   S/p foot surgery   ROV  radiographic x-rays were studied and revealed good positioning of the capital fragment with the K wires intact. Examination of her foot does reveal significant swelling noted on the dorsum which causes her to have the throbbing and pain. Therefore, at this visit. We reapplied her bandage more loosely than  the surgical suite bandage.  We also discussed that she needs to take additional ibuprofen, which would help with both her pain and her swelling at the surgical site. I did not tell her to take additional pain meds since she had that nauseous numbness issue, she is to return the office in one week at which time she desires to discuss with Dr. Paulla Dolly, possibly postponing her next surgery until a later date   Gardiner Barefoot DPM

## 2016-09-25 ENCOUNTER — Ambulatory Visit (INDEPENDENT_AMBULATORY_CARE_PROVIDER_SITE_OTHER): Payer: BLUE CROSS/BLUE SHIELD | Admitting: Podiatry

## 2016-09-25 DIAGNOSIS — Z9889 Other specified postprocedural states: Secondary | ICD-10-CM

## 2016-09-25 DIAGNOSIS — M21619 Bunion of unspecified foot: Secondary | ICD-10-CM

## 2016-09-25 NOTE — Progress Notes (Signed)
Subjective:     Patient ID: Jenna Pierce, female   DOB: Apr 23, 1966, 50 y.o.   MRN: [id number]  HPI patient presents stating that she's doing better with her right foot and she wants to get the left one fixed in several weeks. States that she still has pain if she's on it too much but overall it has improved quite a bit   Review of Systems     Objective:   Physical Exam Neurovascular status intact negative Homan sign was noted with well coapted incision site right first metatarsal with mild edema around the first MPJ localized in nature. Range of motion is good with approximately 25 dorsiflexion 15 plantar flexion with no pain and there is significant structural bunion deformity of the left foot with mild to moderate hallux limitus deformity    Assessment:     Doing well post osteotomy first metatarsal right with continued moderate swelling which is normal for this process and structural bunion deformity with mild hallux limitus left    Plan:     Reviewed previous x-rays and at this point recommended plantar flexion of the hallux to stretch the capsule and dispensed a BioSkin brace in order to lower the big toe with a consistent brace tight. I then dispensed Ace wrap instructed on continued elevation boot usage surgical shoe and we discussed consent form for left foot consisting of correction of the left first metatarsal. Patient wants surgery which will be done in the next couple weeks and signs consent form understanding all possible complications as outlined and the fact that just is 1 foot is doing so well no guarantees the second one will. Understands total recovery from this condylar process to 6 months to one year

## 2016-09-25 NOTE — Patient Instructions (Signed)
Pre-Operative Instructions  Congratulations, you have decided to take an important step to improving your quality of life.  You can be assured that the doctors of [facility] will be with you every step of the way.  1. Plan to be at the surgery center/hospital at least 1 (one) hour prior to your scheduled time unless otherwise directed by the surgical center/hospital staff.  You must have a responsible adult accompany you, remain during the surgery and drive you home.  Make sure you have directions to the surgical center/hospital and know how to get there on time. 2. For hospital based surgery you will need to obtain a history and physical form from your family physician within 1 month prior to the date of surgery- we will give you a form for you primary physician.  3. We make every effort to accommodate the date you request for surgery.  There are however, times where surgery dates or times have to be moved.  We will contact you as soon as possible if a change in schedule is required.   4. No Aspirin/Ibuprofen for one week before surgery.  If you are on aspirin, any non-steroidal anti-inflammatory medications (Mobic, Aleve, Ibuprofen) you should stop taking it 7 days prior to your surgery.  You make take Tylenol  For pain prior to surgery.  5. Medications- If you are taking daily heart and blood pressure medications, seizure, reflux, allergy, asthma, anxiety, pain or diabetes medications, make sure the surgery center/hospital is aware before the day of surgery so they may notify you which medications to take or avoid the day of surgery. 6. No food or drink after midnight the night before surgery unless directed otherwise by surgical center/hospital staff. 7. No alcoholic beverages 24 hours prior to surgery.  No smoking 24 hours prior to or 24 hours after surgery. 8. Wear loose pants or shorts- loose enough to fit over bandages, boots, and casts. 9. No slip on shoes, sneakers are best. 10. Bring  your boot with you to the surgery center/hospital.  Also bring crutches or a walker if your physician has prescribed it for you.  If you do not have this equipment, it will be provided for you after surgery. 11. If you have not been contracted by the surgery center/hospital by the day before your surgery, call to confirm the date and time of your surgery. 12. Leave-time from work may vary depending on the type of surgery you have.  Appropriate arrangements should be made prior to surgery with your employer. 13. Prescriptions will be provided immediately following surgery by your doctor.  Have these filled as soon as possible after surgery and take the medication as directed. 14. Remove nail polish on the operative foot. 15. Wash the night before surgery.  The night before surgery wash the foot and leg well with the antibacterial soap provided and water paying special attention to beneath the toenails and in between the toes.  Rinse thoroughly with water and dry well with a towel.  Perform this wash unless told not to do so by your physician.  Enclosed: 1 Ice pack (please put in freezer the night before surgery)   1 Hibiclens skin cleaner   Pre-op Instructions  If you have any questions regarding the instructions, do not hesitate to call our office.  [place]: [address] [place], [place] [address] [phone]  [place]: [address] [place], [place] [address] [phone]  [place]: [address]  [place], [place] [address] [phone]   Dr.   Norman Regal DPM, Dr. Matthew Wagoner DPM, Dr. M. Todd Hyatt DPM, Dr. Titorya Stover DPM 

## 2016-10-03 ENCOUNTER — Telehealth: Payer: Self-pay | Admitting: *Deleted

## 2016-10-03 NOTE — Telephone Encounter (Signed)
"  I am calling to check on my surgery for next Tuesday November 21 with Dr. Paulla Dolly.  I have not heard from the surgical center yet.  I just want to follow-up to make sure you have me on the schedule.  Could you let me know?"

## 2016-10-07 ENCOUNTER — Encounter: Payer: Self-pay | Admitting: Podiatry

## 2016-10-07 DIAGNOSIS — M21612 Bunion of left foot: Secondary | ICD-10-CM | POA: Diagnosis not present

## 2016-10-07 DIAGNOSIS — M2012 Hallux valgus (acquired), left foot: Secondary | ICD-10-CM | POA: Diagnosis not present

## 2016-10-15 ENCOUNTER — Ambulatory Visit (INDEPENDENT_AMBULATORY_CARE_PROVIDER_SITE_OTHER): Payer: BLUE CROSS/BLUE SHIELD | Admitting: Podiatry

## 2016-10-15 ENCOUNTER — Ambulatory Visit (INDEPENDENT_AMBULATORY_CARE_PROVIDER_SITE_OTHER): Payer: BLUE CROSS/BLUE SHIELD

## 2016-10-15 ENCOUNTER — Encounter: Payer: Self-pay | Admitting: Podiatry

## 2016-10-15 VITALS — Temp 96.9°F

## 2016-10-15 DIAGNOSIS — M21619 Bunion of unspecified foot: Secondary | ICD-10-CM

## 2016-10-15 DIAGNOSIS — Z9889 Other specified postprocedural states: Secondary | ICD-10-CM

## 2016-10-15 MED ORDER — CEPHALEXIN 500 MG PO CAPS: 500.0000 mg | ORAL_CAPSULE | Freq: Three times a day (TID) | ORAL | 1 refills | Status: DC

## 2016-10-15 NOTE — Telephone Encounter (Signed)
Someone from the surgical center contacted the patient.

## 2016-10-16 NOTE — Progress Notes (Signed)
Subjective:     Patient ID: Jenna Pierce, female   DOB: 1966-11-03, 50 y.o.   MRN: [id number]  HPI patient presents for first visit for Austin-type osteotomy performed left and does admit that she has walked on her foot without her boot around the house and we did clean up her dressing last Friday without taking the underlying dressing off. Admits she's been excessively active on it at this time   Review of Systems     Objective:   Physical Exam Neurovascular status intact negative Homans sign noted with moderate increase in edema in the left forefoot secondary to excessive activity and walking without immobilization. Patient's incision site itself is slightly gapping with no erythema or edema surrounding the area and no proximal edema erythema drainage noted. There was no active drainage or anything for culture at this time    Assessment:     Tremendous stress on the osteotomy and incision site secondary to noncompliance and walking without proper immobilization    Plan:     H&P conditions reviewed with patient. I stressed on the importance of immobilization at this. During the postop and that there is a small crack in her first metatarsal and also stress on her incision. I did apply Iodosorb to the incision and applied sterile dressing and instructed on the importance of elevation immobilization for the next week and also as precautionary measure placed her on cephalexin 500 mg 3 times a day. Gave her strict instructions if any redness swelling or any systemic signs of infection were to occur she is to contact his [place] immediately and go to the emergency room  X-ray report indicates that the osteotomy has a small crack line to the center portion of the first metatarsal with slight elevation of the dorsal portion of the osteotomy but I do think it's stable the pins are holding and it should heal at this position. Conveyed with these findings to the patient and explained the importance of complete  immobilization of the

## 2016-10-17 NOTE — Progress Notes (Signed)
DOS 11.21.2017 Austin Bunionectomy (cutting and moving bone) with Pin Fixation Left Foot

## 2016-10-22 ENCOUNTER — Ambulatory Visit (INDEPENDENT_AMBULATORY_CARE_PROVIDER_SITE_OTHER): Payer: BLUE CROSS/BLUE SHIELD

## 2016-10-22 ENCOUNTER — Ambulatory Visit (INDEPENDENT_AMBULATORY_CARE_PROVIDER_SITE_OTHER): Payer: BLUE CROSS/BLUE SHIELD | Admitting: Podiatry

## 2016-10-22 DIAGNOSIS — M7662 Achilles tendinitis, left leg: Secondary | ICD-10-CM | POA: Diagnosis not present

## 2016-10-22 DIAGNOSIS — M21619 Bunion of unspecified foot: Secondary | ICD-10-CM | POA: Diagnosis not present

## 2016-10-22 DIAGNOSIS — M7661 Achilles tendinitis, right leg: Secondary | ICD-10-CM

## 2016-10-23 ENCOUNTER — Encounter: Payer: Self-pay | Admitting: Podiatry

## 2016-10-23 NOTE — Progress Notes (Signed)
Subjective:     Patient ID: Jenna Pierce, female   DOB: Jan 26, 1966, 50 y.o.   MRN: [id number]  HPI patient states she's doing a little better and she admits that she is being more compliant this week than the first week and trying to keep it elevated and take it easy   Review of Systems     Objective:   Physical Exam Neurovascular status intact with healing surgical site left first metatarsal with no current drainage noted and edema around the first metatarsal bilateral localized in nature that is gradually getting better right and left with no proximal edema erythema or drainage noted    Assessment:     Patient had been extremely noncompliant developed a crack in the first metatarsal head left to had some localized drainage that is now resolved left    Plan:     H&P x-ray reviewed bilateral and instructed the patient on compression elevation and continued monitoring of the first metatarsal incision site. If any redness or drainage were to occur she is to let us know immediately  X-ray report indicates is been some secondary bone healing of the right first metatarsal probably due to excessive ambulation and the left are first MPJ has a crack line in it secondary to her walking barefoot right after surgery. It is stable from previous visit

## 2016-10-29 ENCOUNTER — Ambulatory Visit (INDEPENDENT_AMBULATORY_CARE_PROVIDER_SITE_OTHER): Payer: BLUE CROSS/BLUE SHIELD

## 2016-10-29 ENCOUNTER — Ambulatory Visit (INDEPENDENT_AMBULATORY_CARE_PROVIDER_SITE_OTHER): Payer: BLUE CROSS/BLUE SHIELD | Admitting: Podiatry

## 2016-10-29 DIAGNOSIS — M2010 Hallux valgus (acquired), unspecified foot: Secondary | ICD-10-CM

## 2016-10-29 DIAGNOSIS — Z9889 Other specified postprocedural states: Secondary | ICD-10-CM

## 2016-10-29 NOTE — Progress Notes (Signed)
Subjective:     Patient ID: Jenna Pierce, female   DOB: 02-Nov-1966, 50 y.o.   MRN: [id number]  HPI patient states she's doing much better with her foot and she admits that she has been much more compliant   Review of Systems     Objective:   Physical Exam Neurovascular status intact muscle strength adequate with discomfort in the left foot that continues to improve with wound edges that are well coapted crusted over with no drainage and good range of motion with mild edema around the first MPJ left    Assessment:     Patient is being much more compliant and appears to be healing well from previous surgery    Plan:     H&P condition reviewed and went ahead and recommended continued immobilization for several weeks found by gradual return to soft shoe but not doing anything aggressive. Patient will be seen back in approximate 3-4 weeks or earlier if needed  X-ray report indicates there has been movement of the capital fragment but it's stable from previous x-rays and pins are holding

## 2016-11-14 ENCOUNTER — Ambulatory Visit (INDEPENDENT_AMBULATORY_CARE_PROVIDER_SITE_OTHER): Payer: BLUE CROSS/BLUE SHIELD

## 2016-11-14 ENCOUNTER — Ambulatory Visit (INDEPENDENT_AMBULATORY_CARE_PROVIDER_SITE_OTHER): Payer: BLUE CROSS/BLUE SHIELD | Admitting: Podiatry

## 2016-11-14 DIAGNOSIS — M7661 Achilles tendinitis, right leg: Secondary | ICD-10-CM | POA: Diagnosis not present

## 2016-11-14 DIAGNOSIS — M2012 Hallux valgus (acquired), left foot: Secondary | ICD-10-CM

## 2016-11-14 DIAGNOSIS — M7662 Achilles tendinitis, left leg: Secondary | ICD-10-CM | POA: Diagnosis not present

## 2016-11-14 DIAGNOSIS — M2010 Hallux valgus (acquired), unspecified foot: Secondary | ICD-10-CM

## 2016-11-14 DIAGNOSIS — Z9889 Other specified postprocedural states: Secondary | ICD-10-CM

## 2016-11-14 MED ORDER — TRIAMCINOLONE ACETONIDE 10 MG/ML IJ SUSP
10.0000 mg | Freq: Once | INTRAMUSCULAR | Status: AC
  Administered 2016-11-14: 10 mg

## 2016-11-14 NOTE — Progress Notes (Signed)
Subjective:     Patient ID: Jenna Pierce, female   DOB: May 03, 1966, 50 y.o.   MRN: [id number]  HPI patient states the big toe joints on both feet are doing pretty well with continued mild edema but no extreme discomfort or swelling. Patient's found have exquisite discomfort posterior medial aspect of each heel at the insertion of the Achilles and the patient is due to go to [employer] in 10 days and his plantar flexion we'll not be able to go comfortably   Review of Systems     Objective:   Physical Exam Neuro vascular status was found to be intact with patient found to have edema still around the first metatarsal left over right secondary to noncompliance but good range of motion no crepitus and incisions that are healing well. I noted exquisite posterior pain medial side at the insertion Achilles into the heel bone    Assessment:     Acute Achilles tendinitis bilateral medial side    Plan:     H&P x-rays reviewed and careful injection administered after discussing risk of rupture with patient. I utilized 5 mg dexamethasone Kenalog 5 mill grams Xylocaine and then applied silicone sheeting to the back in order to compress and to cushion the areas. I instructed on aggressive ice therapy and reduced activity for the next few days  X-rays indicate the first metatarsals are stable and while there's been significant fracture left first metatarsal she is functioning well clinically and it should do okay. We'll need to be continued watch but I did not note any further worsening of the symptoms with posterior spurring noted

## 2016-12-01 DIAGNOSIS — N390 Urinary tract infection, site not specified: Secondary | ICD-10-CM | POA: Diagnosis not present

## 2016-12-01 DIAGNOSIS — N760 Acute vaginitis: Secondary | ICD-10-CM | POA: Diagnosis not present

## 2016-12-08 DIAGNOSIS — Z1231 Encounter for screening mammogram for malignant neoplasm of breast: Secondary | ICD-10-CM | POA: Diagnosis not present

## 2016-12-08 DIAGNOSIS — Z6838 Body mass index (BMI) 38.0-38.9, adult: Secondary | ICD-10-CM | POA: Diagnosis not present

## 2016-12-08 DIAGNOSIS — Z01419 Encounter for gynecological examination (general) (routine) without abnormal findings: Secondary | ICD-10-CM | POA: Diagnosis not present

## 2016-12-08 LAB — HM PAP SMEAR: HM Pap smear: NEGATIVE

## 2016-12-08 LAB — HM MAMMOGRAPHY

## 2016-12-10 ENCOUNTER — Encounter: Payer: BLUE CROSS/BLUE SHIELD | Admitting: Podiatry

## 2016-12-10 ENCOUNTER — Ambulatory Visit: Payer: BLUE CROSS/BLUE SHIELD | Admitting: Podiatry

## 2016-12-10 LAB — HM MAMMOGRAPHY

## 2016-12-15 ENCOUNTER — Ambulatory Visit: Payer: BLUE CROSS/BLUE SHIELD | Admitting: Podiatry

## 2016-12-17 ENCOUNTER — Ambulatory Visit (INDEPENDENT_AMBULATORY_CARE_PROVIDER_SITE_OTHER): Payer: Self-pay | Admitting: Podiatry

## 2016-12-17 ENCOUNTER — Ambulatory Visit (INDEPENDENT_AMBULATORY_CARE_PROVIDER_SITE_OTHER): Payer: BLUE CROSS/BLUE SHIELD

## 2016-12-17 DIAGNOSIS — M7661 Achilles tendinitis, right leg: Secondary | ICD-10-CM

## 2016-12-17 DIAGNOSIS — M2012 Hallux valgus (acquired), left foot: Secondary | ICD-10-CM

## 2016-12-17 DIAGNOSIS — M7662 Achilles tendinitis, left leg: Secondary | ICD-10-CM

## 2016-12-18 NOTE — Progress Notes (Signed)
Subjective:     Patient ID: Jenna Pierce, female   DOB: 1966/08/30, 51 y.o.   MRN: [id number]  HPI patient states she's doing pretty well with pain if she's on her feet too long but overall is walking better and has been more compliant   Review of Systems     Objective:   Physical Exam Neurovascular status intact muscle strength adequate with patient's feet doing well with mild edema good alignment of the first MPJ and range of motion    Assessment:     Doing well post surgery bilateral with movement of the first metatarsal left which appears to have healed    Plan:     X-ray reviewed and patient can increase activity gradually over the next few weeks and then hopefully return to different types shoes. Patient be seen back in 4 weeks or earlier if needed  X-ray report indicates osteotomies are healing with movement of the first left but it is consolidating and should function well the position and it's

## 2017-02-04 ENCOUNTER — Ambulatory Visit (INDEPENDENT_AMBULATORY_CARE_PROVIDER_SITE_OTHER): Payer: BLUE CROSS/BLUE SHIELD | Admitting: Podiatry

## 2017-02-04 ENCOUNTER — Ambulatory Visit (INDEPENDENT_AMBULATORY_CARE_PROVIDER_SITE_OTHER): Payer: BLUE CROSS/BLUE SHIELD

## 2017-02-04 DIAGNOSIS — M2010 Hallux valgus (acquired), unspecified foot: Secondary | ICD-10-CM | POA: Diagnosis not present

## 2017-02-04 DIAGNOSIS — Z472 Encounter for removal of internal fixation device: Secondary | ICD-10-CM | POA: Diagnosis not present

## 2017-02-04 DIAGNOSIS — M722 Plantar fascial fibromatosis: Secondary | ICD-10-CM

## 2017-02-04 MED ORDER — TRIAMCINOLONE ACETONIDE 10 MG/ML IJ SUSP
10.0000 mg | Freq: Once | INTRAMUSCULAR | Status: AC
  Administered 2017-02-04: 10 mg

## 2017-02-09 NOTE — Progress Notes (Signed)
Subjective:     Patient ID: Jenna Pierce, female   DOB: Jan 03, 1966, 51 y.o.   MRN: [id number]  HPI patient states that she feels a prominence on top of her left foot and she is due to go to [place] and states her heels are really bothering her   Review of Systems     Objective:   Physical Exam Neurovascular status intact negative Homans sign noted with patient's left showing a prominent pin position of the first metatarsal with discomfort of a significant nature in the plantar heels bilateral    Assessment:     Probable abnormal pin position left secondary to the significant trauma that this patient has undergone with plantar fasciitis bilateral    Plan:     H&P conditions reviewed and today injected the fascial band 3 mg Kenalog 5 mg Xylocaine bilateral and discussed the pin and reviewed x-rays and recommended removal. I explained procedure and risk and she wants surgery and she read consent form going over alternative treatments complications understanding removal of pin. Patient is scheduled for procedure to be done  X-ray indicates the more proximal pin is prominent and causing her problems

## 2017-02-11 ENCOUNTER — Ambulatory Visit: Payer: BLUE CROSS/BLUE SHIELD | Admitting: Podiatry

## 2017-02-23 ENCOUNTER — Encounter: Payer: Self-pay | Admitting: Podiatry

## 2017-02-23 ENCOUNTER — Ambulatory Visit (INDEPENDENT_AMBULATORY_CARE_PROVIDER_SITE_OTHER): Payer: BLUE CROSS/BLUE SHIELD | Admitting: Podiatry

## 2017-02-23 VITALS — BP 136/81 | HR 82 | Temp 98.3°F | Resp 16

## 2017-02-23 DIAGNOSIS — Z472 Encounter for removal of internal fixation device: Secondary | ICD-10-CM

## 2017-02-23 NOTE — Progress Notes (Signed)
Subjective:     Patient ID: Jenna Pierce, female   DOB: 06-Jan-1966, 51 y.o.   MRN: [id number]  HPI patient presents with a painful left first metatarsal pin it's prominent and sore when palpated   Review of Systems     Objective:   Physical Exam Neurovascular status intact with prominent pin position first metatarsal left secondary to trauma    Assessment:     Prominent pin position left    Plan:     Patient was anesthetized with 100 mg Xylocaine Marcaine mixture and the patient was taken to the operating room and the left foot was prepped and draped utilizing standard aseptic technique. The left tourniquet was then inflated after the foot was exsanguinated properly and the left first metatarsal was exposed. Utilizing sharp dissection the left first metatarsal was entered and I removed some thickened tissue. I then took the incision down to capsule and made a linear capsular incision and exposed the pin which was loose and popped in a dorsal direction. I removed the pin in toto flushed the wound and sutured with 5-0 nylon and applied sterile dressing. Tourniquet was released and capillary fills noted to be immediate and patient was taken out of the OR in satisfactory condition

## 2017-03-09 ENCOUNTER — Other Ambulatory Visit: Payer: BLUE CROSS/BLUE SHIELD

## 2017-03-09 ENCOUNTER — Ambulatory Visit (INDEPENDENT_AMBULATORY_CARE_PROVIDER_SITE_OTHER): Payer: BLUE CROSS/BLUE SHIELD | Admitting: Podiatry

## 2017-03-09 ENCOUNTER — Ambulatory Visit (INDEPENDENT_AMBULATORY_CARE_PROVIDER_SITE_OTHER): Payer: BLUE CROSS/BLUE SHIELD

## 2017-03-09 DIAGNOSIS — M7662 Achilles tendinitis, left leg: Secondary | ICD-10-CM

## 2017-03-09 DIAGNOSIS — Z472 Encounter for removal of internal fixation device: Secondary | ICD-10-CM

## 2017-03-09 DIAGNOSIS — M7661 Achilles tendinitis, right leg: Secondary | ICD-10-CM

## 2017-03-09 DIAGNOSIS — M2012 Hallux valgus (acquired), left foot: Secondary | ICD-10-CM | POA: Diagnosis not present

## 2017-03-11 NOTE — Progress Notes (Signed)
Subjective:    Patient ID: Jenna Pierce, female   DOB: 51 y.o.   MRN: [id number]   HPI patient states that her big toe joints are doing very well but she's having a lot of pain in her heels bilateral posterior heel mostly on the lateral side of the left and the medial side of the right but the entire heel is involved. Patient states they are gradually becoming more debilitating for her    ROS      Objective:  Physical Exam Neurovascular status intact negative Homans sign was noted with patient found to have good healing of the first MPJ bilateral with range of motion that's adequate satisfactory removal of pin from the left but does have quite a bit of discomfort in the posterior heel region bilateral with inflammation fluid buildup noted    Assessment:     Chronic Achilles tendinitis bilateral with chronic inflammation that's not respond to previous injection immobilization    Plan:    H&P condition reviewed and at this time I discussed treatment options after reviewing x-rays. I did go ahead today and I've recommended shockwave and patient is scheduled for shockwave therapy and I educated her on this the fact it may or may not solve her problem and ultimately could require open surgery  X-rays indicate spurs are increasing bilateral with increased reactivity noted with good healing around the first metatarsal bilateral

## 2017-03-16 ENCOUNTER — Ambulatory Visit (INDEPENDENT_AMBULATORY_CARE_PROVIDER_SITE_OTHER): Payer: Self-pay

## 2017-03-16 DIAGNOSIS — M7662 Achilles tendinitis, left leg: Secondary | ICD-10-CM

## 2017-03-16 DIAGNOSIS — M7661 Achilles tendinitis, right leg: Secondary | ICD-10-CM

## 2017-03-16 DIAGNOSIS — R52 Pain, unspecified: Secondary | ICD-10-CM

## 2017-03-20 NOTE — Progress Notes (Signed)
Pt presents with ongoing bilateral achilles tendon pain lasting several months.   Pain on palpation to the posterior tendon at insertion site 8 of 10  ESWT administered to bilateral heels for 12 joules. EPAT delivered to surrounding connective tissues at 3000 pulses. She was advised against NSAIDs and ice and advised on boot usage

## 2017-03-23 ENCOUNTER — Ambulatory Visit: Payer: BLUE CROSS/BLUE SHIELD

## 2017-03-23 ENCOUNTER — Other Ambulatory Visit: Payer: BLUE CROSS/BLUE SHIELD

## 2017-04-02 ENCOUNTER — Ambulatory Visit (INDEPENDENT_AMBULATORY_CARE_PROVIDER_SITE_OTHER): Payer: BLUE CROSS/BLUE SHIELD | Admitting: Podiatry

## 2017-04-02 DIAGNOSIS — M7662 Achilles tendinitis, left leg: Secondary | ICD-10-CM

## 2017-04-02 DIAGNOSIS — M7661 Achilles tendinitis, right leg: Secondary | ICD-10-CM

## 2017-04-07 NOTE — Progress Notes (Signed)
Pt presents with ongoing bilateral achilles tendon pain lasting several months.   Pain on palpation to the posterior tendon at insertion site 8 of 10  ESWT administered to bilateral heels for 12 joules. EPAT delivered to surrounding connective tissues at 3000 pulses. She was advised against NSAIDs and ice and advised on boot usage

## 2017-04-10 NOTE — Progress Notes (Signed)
DOS 02/23/2017 Removal pin from top (left) foot

## 2017-05-04 ENCOUNTER — Other Ambulatory Visit: Payer: BLUE CROSS/BLUE SHIELD

## 2017-05-08 ENCOUNTER — Ambulatory Visit: Payer: Self-pay | Admitting: Podiatry

## 2017-05-08 ENCOUNTER — Ambulatory Visit (INDEPENDENT_AMBULATORY_CARE_PROVIDER_SITE_OTHER): Payer: BLUE CROSS/BLUE SHIELD

## 2017-05-08 DIAGNOSIS — M2012 Hallux valgus (acquired), left foot: Secondary | ICD-10-CM

## 2017-05-08 DIAGNOSIS — M7662 Achilles tendinitis, left leg: Secondary | ICD-10-CM

## 2017-05-08 DIAGNOSIS — Z472 Encounter for removal of internal fixation device: Secondary | ICD-10-CM

## 2017-05-08 DIAGNOSIS — M7661 Achilles tendinitis, right leg: Secondary | ICD-10-CM

## 2017-05-08 NOTE — Progress Notes (Signed)
Subjective:    Patient ID: Jenna Pierce, female   DOB: 51 y.o.   MRN: [id number]   HPI patient presents with continued posterior pain heel stating it's somewhat better and is concerned that there may be a second pin that's protruding on the left first metatarsal    ROS      Objective:  Physical Exam neurovascular status intact with inflammation around the Achilles tendon insertion bilateral that is improved but still present with a small area prominence on the shaft of the metatarsal left first     Assessment:    Possibility for abnormal pin position versus a small amount of scar tissue dorsal left     Plan:    H&P x-ray left reviewed and shockwave administered today. Patient will be seen back in about 8 weeks and understands eventually this could require surgical intervention  X-ray left indicated is not in proximity to the pin

## 2017-07-10 ENCOUNTER — Ambulatory Visit (INDEPENDENT_AMBULATORY_CARE_PROVIDER_SITE_OTHER): Payer: BLUE CROSS/BLUE SHIELD

## 2017-07-10 ENCOUNTER — Other Ambulatory Visit: Payer: Self-pay | Admitting: Podiatry

## 2017-07-10 ENCOUNTER — Telehealth: Payer: Self-pay | Admitting: *Deleted

## 2017-07-10 ENCOUNTER — Ambulatory Visit (INDEPENDENT_AMBULATORY_CARE_PROVIDER_SITE_OTHER): Payer: BLUE CROSS/BLUE SHIELD | Admitting: Podiatry

## 2017-07-10 DIAGNOSIS — M7661 Achilles tendinitis, right leg: Secondary | ICD-10-CM | POA: Diagnosis not present

## 2017-07-10 DIAGNOSIS — M7662 Achilles tendinitis, left leg: Secondary | ICD-10-CM

## 2017-07-10 DIAGNOSIS — M7660 Achilles tendinitis, unspecified leg: Secondary | ICD-10-CM

## 2017-07-10 DIAGNOSIS — M79671 Pain in right foot: Secondary | ICD-10-CM

## 2017-07-10 DIAGNOSIS — M722 Plantar fascial fibromatosis: Secondary | ICD-10-CM

## 2017-07-10 DIAGNOSIS — M79672 Pain in left foot: Secondary | ICD-10-CM

## 2017-07-10 MED ORDER — TRIAMCINOLONE ACETONIDE 10 MG/ML IJ SUSP
10.0000 mg | Freq: Once | INTRAMUSCULAR | Status: AC
  Administered 2017-07-10: 10 mg

## 2017-07-10 NOTE — Progress Notes (Signed)
Subjective:    Patient ID: Jenna Pierce, female   DOB: 51 y.o.   MRN: [id number]   HPI patient states the back of my heels have really been hurting me and I was hoping I can get another injection today and I know I might need surgery some day but I simply cannot do it at this time    ROS      Objective:  Physical Exam neurovascular status intact with patient found to have posterior discomfort in the heel bilateral medial side that's painful when pressed with enlargement of the Achilles tendon insertion bilateral and it appears to grow slightly with failure to respond to previous shockwave therapy immobilization and anti-inflammatories ice and 1 injection 10 months ago     Assessment:   Chronic Achilles tendinitis bilateral      Plan:    X-rays were taken reviewed and at this time I did discuss that at one point this will probably require surgical intervention but she simply cannot do this now and knows someday she'll probably need to get it done. I did go ahead today and I discussed injections and explain the risk associated with them including chances for rupture and she is willing to accept risk and today I carefully injected the medial side of the Achilles not directly related inserts into the calcaneus with 3 mg Dexon some Kenalog 5 mill grams Xylocaine and we will start physical therapy in about 2 weeks and I will see her back in 6 weeks or earlier if needed  X-rays indicate that there is spurring of the posterior heel region bilateral of a significant nature

## 2017-07-10 NOTE — Telephone Encounter (Signed)
Faxed orders to [facility] with hand written notice to begin PT 2 weeks from Now.

## 2017-08-11 DIAGNOSIS — M25672 Stiffness of left ankle, not elsewhere classified: Secondary | ICD-10-CM | POA: Diagnosis not present

## 2017-08-11 DIAGNOSIS — M25572 Pain in left ankle and joints of left foot: Secondary | ICD-10-CM | POA: Diagnosis not present

## 2017-08-11 DIAGNOSIS — M25671 Stiffness of right ankle, not elsewhere classified: Secondary | ICD-10-CM | POA: Diagnosis not present

## 2017-08-11 DIAGNOSIS — M25571 Pain in right ankle and joints of right foot: Secondary | ICD-10-CM | POA: Diagnosis not present

## 2017-08-13 DIAGNOSIS — M25672 Stiffness of left ankle, not elsewhere classified: Secondary | ICD-10-CM | POA: Diagnosis not present

## 2017-08-13 DIAGNOSIS — M25571 Pain in right ankle and joints of right foot: Secondary | ICD-10-CM | POA: Diagnosis not present

## 2017-08-13 DIAGNOSIS — M25572 Pain in left ankle and joints of left foot: Secondary | ICD-10-CM | POA: Diagnosis not present

## 2017-08-13 DIAGNOSIS — M25671 Stiffness of right ankle, not elsewhere classified: Secondary | ICD-10-CM | POA: Diagnosis not present

## 2017-08-18 DIAGNOSIS — M25671 Stiffness of right ankle, not elsewhere classified: Secondary | ICD-10-CM | POA: Diagnosis not present

## 2017-08-18 DIAGNOSIS — M25571 Pain in right ankle and joints of right foot: Secondary | ICD-10-CM | POA: Diagnosis not present

## 2017-08-18 DIAGNOSIS — M25572 Pain in left ankle and joints of left foot: Secondary | ICD-10-CM | POA: Diagnosis not present

## 2017-08-18 DIAGNOSIS — M25672 Stiffness of left ankle, not elsewhere classified: Secondary | ICD-10-CM | POA: Diagnosis not present

## 2017-08-21 ENCOUNTER — Ambulatory Visit (INDEPENDENT_AMBULATORY_CARE_PROVIDER_SITE_OTHER): Payer: BLUE CROSS/BLUE SHIELD | Admitting: Physician Assistant

## 2017-08-21 ENCOUNTER — Encounter: Payer: Self-pay | Admitting: Physician Assistant

## 2017-08-21 ENCOUNTER — Ambulatory Visit: Payer: BLUE CROSS/BLUE SHIELD | Admitting: Podiatry

## 2017-08-21 VITALS — BP 130/88 | HR 84 | Temp 98.7°F | Ht 66.5 in | Wt 254.5 lb

## 2017-08-21 DIAGNOSIS — F418 Other specified anxiety disorders: Secondary | ICD-10-CM | POA: Diagnosis not present

## 2017-08-21 DIAGNOSIS — Z13220 Encounter for screening for lipoid disorders: Secondary | ICD-10-CM | POA: Diagnosis not present

## 2017-08-21 DIAGNOSIS — Z0001 Encounter for general adult medical examination with abnormal findings: Secondary | ICD-10-CM | POA: Diagnosis not present

## 2017-08-21 DIAGNOSIS — R7989 Other specified abnormal findings of blood chemistry: Secondary | ICD-10-CM

## 2017-08-21 NOTE — Progress Notes (Signed)
Jenna Pierce is a 51 y.o. female here to [employer] and Annual physical exam.  I acted as a Education administrator for [employer], PA-C Anselmo Pickler, LPN  History of Present Illness:   Chief Complaint  Patient presents with  . Establish Care    BC/BS  . Annual Exam    Acute Concerns: Situational Anxiety -- has had an increase in anxiety over the past couple of years, and has had to suppress emotions and deal with grief with mother's illness and father's death, has not sought medical attention for her anxiety yet until today. She is agreeable to talk therapy. She is also emotional about her daughter going off to college, who is currently a senior this year. She has issues with comfort eating and is upset with her weight gain, and inability to work out as much as she usually does due to some recent orthopedic issues.  GAD 7 : Generalized Anxiety Score 08/21/2017  Nervous, Anxious, on Edge 3  Control/stop worrying 3  Worry too much - different things 3  Trouble relaxing 3  Restless 1  Easily annoyed or irritable 3  Afraid - awful might happen 0  Total GAD 7 Score 16   Chronic Issues: Obesity -- she has been dealing with her weight for quite sometime. She is currently going to PT for her bilateral foot pain and states that she will likely need surgery at some point for this. She is currently at her heaviest weight.  Health Maintenance: Immunizations -- declined flu shot, will request recent records Colonoscopy -- no early colonoscopy, start at age 31 Mammogram -- up to date on mammograms, Dr. Nori Riis manages this PAP -- no abnormal, up to date with Dr. Marita [place] Density --  bone scan in 2017 because she had two fractures in a row and orthopedist was concerned, also mother diagnosed with osteoporosis - states that DEXA was normal Diet -- doesn't eat a ton of meat (no pork, beef, seafood), loves carbohydrates, comfort eating (with stress) Caffeine intake -- 2 cups of coffee in the AM, sweetened  tea (one M or L at lunch + 1 refill) Sleep habits -- situational, best sleep 6-10a, considers herself a "night owl", does snore Exercise -- required knee injections, currently going to PT for achilles issues Weight -- Weight: 254 lb 8 oz (115.4 kg)  Mood -- anxious  Depression screen PHQ 2/9 08/21/2017  Down, Depressed, Hopeless 1  PHQ - 2 Score 1    Other providers/specialists: Dr. Nori Riis -- [facility] - ob-gyn Dr. Nicki Reaper -- eye doc Dr. Paulla Dolly -- podiatrist   Past Medical History:  Diagnosis Date  . Ovarian cyst      Social History   Social History  . Marital status: Single    Spouse name: N/A  . Number of children: N/A  . Years of education: N/A   Occupational History  . Not on file.   Social History Main Topics  . Smoking status: Never Smoker  . Smokeless tobacco: Never Used  . Alcohol use Yes     Comment: 3 drinks a week wine or beer  . Drug use: No  . Sexual activity: Yes   Other Topics Concern  . Not on file   Social History Narrative   Part-time tutor for [employer]    Past Surgical History:  Procedure Laterality Date  . ABDOMINAL HYSTERECTOMY    . BUNIONECTOMY Bilateral 2017   done 4 weeks apart  . distal radius repair  Left 2016  . FRACTURE SURGERY    . pin and repair 5th metacarpal  2017  . ROBOTIC ASSISTED TOTAL HYSTERECTOMY WITH BILATERAL SALPINGO OOPHERECTOMY  08/24/2015   [facility] Nancy Marus [facility]  . WRIST FRACTURE SURGERY Left 02/26/2015   hardware and screws placed    Family History  Problem Relation Age of Onset  . Diabetes Father   . Arthritis Father   . Hyperlipidemia Father   . Hypertension Father   . Heart attack Father   . Breast cancer Paternal Grandmother   . Arthritis Mother   . COPD Mother   . Depression Mother   . Alzheimer's disease Mother   . Alcohol abuse Maternal Grandfather     No Known Allergies   Current Medications:   Current Outpatient Prescriptions:  .   estradiol (CLIMARA - DOSED IN MG/24 HR) 0.05 mg/24hr patch, APPLY 1 PATCH WEEKLY AS DIRECTED, Disp: , Rfl: 4   Review of Systems:   Review of Systems  Constitutional: Negative for chills, fever, malaise/fatigue and weight loss.  HENT: Negative for hearing loss, sinus pain and sore throat.   Respiratory: Negative for cough and hemoptysis.   Cardiovascular: Negative for chest pain, palpitations, leg swelling and PND.  Gastrointestinal: Negative for abdominal pain, constipation, diarrhea, heartburn, nausea and vomiting.  Genitourinary: Negative for dysuria, frequency and urgency.  Musculoskeletal: Negative for back pain, myalgias and neck pain.  Skin: Negative for itching and rash.  Neurological: Negative for dizziness, tingling, seizures and headaches.  Endo/Heme/Allergies: Negative for polydipsia.  Psychiatric/Behavioral: Negative for depression. The patient is nervous/anxious and has insomnia.     Vitals:   Vitals:   08/21/17 1521  BP: 130/88  Pulse: 84  Temp: 98.7 F (37.1 C)  TempSrc: Oral  SpO2: 97%  Weight: 254 lb 8 oz (115.4 kg)  Height: 5' 6.5" (1.689 m)     Body mass index is 40.46 kg/m.  Waist circumference: 47.5 in  Physical Exam:   Physical Exam  Constitutional: She appears well-developed and well-nourished. She is cooperative.  Non-toxic appearance. She does not have a sickly appearance. She does not appear ill. No distress.  HENT:  Head: Normocephalic and atraumatic.  Right Ear: Tympanic membrane, external ear and ear canal normal. Tympanic membrane is not erythematous, not retracted and not bulging.  Left Ear: Tympanic membrane, external ear and ear canal normal. Tympanic membrane is not erythematous, not retracted and not bulging.  Eyes: Pupils are equal, round, and reactive to light. Conjunctivae, EOM and lids are normal.  Neck: Trachea normal and full passive range of motion without pain.  Cardiovascular: Normal rate, regular rhythm, S1 normal, S2 normal,  normal heart sounds, intact distal pulses and normal pulses.   No LE edema  Pulmonary/Chest: Effort normal and breath sounds normal. No tachypnea. No respiratory distress. She has no decreased breath sounds. She has no wheezes. She has no rhonchi. She has no rales.  Abdominal: Soft. Normal appearance and bowel sounds are normal. There is no tenderness.  Genitourinary:  Genitourinary Comments: Did not perform today  Musculoskeletal: Normal range of motion.  Lymphadenopathy:    She has no cervical adenopathy.  Neurological: She is alert. She has normal reflexes. No cranial nerve deficit or sensory deficit. GCS eye subscore is 4. GCS verbal subscore is 5. GCS motor subscore is 6.  Skin: Skin is warm, dry and intact.  Psychiatric: She has a normal mood and affect. Her speech is normal and behavior is normal.  Pleasant  and cooperative  Nursing note and vitals reviewed.   Assessment and Plan:    Viva was seen today for establish care and annual exam.  Diagnoses and all orders for this visit:  Encounter for general adult medical examination with abnormal findings Today patient counseled on age appropriate routine health concerns for screening and prevention, each reviewed and up to date or declined. Immunizations reviewed and up to date or declined. Labs ordered and reviewed. Risk factors for depression reviewed and negative. Hearing function and visual acuity are intact. ADLs screened and addressed as needed. Functional ability and level of safety reviewed and appropriate. Education, counseling and referrals performed based on assessed risks today. Patient provided with a copy of personalized plan for preventive services. -     CBC with Differential/Platelet; Future -     Comprehensive metabolic panel; Future  Situational anxiety GAD-7 is 16 today. She is agreeable to therapy. She will consider medications while we are waiting for her labs to return. Discussed that I would like to start  either Zoloft or Lexapro -- she reports that she will do some reading on these options and think about it. I discussed with patient that if they develop any SI, to tell someone immediately and seek medical attention. Follow-up in 1 month for this. -     TSH; Future  Class 3 severe obesity in adult, unspecified BMI, unspecified obesity type, unspecified whether serious comorbidity present [facility]) Discussed return visit for nutrition education. She is agreeable to this. Encouraged her to schedule this at her convenience. Encouraged physical activity per PT recommendations. -     TSH; Future  Encounter for screening for lipid disorder -     Lipid panel; Future  . Reviewed expectations re: course of current medical issues. . Discussed self-management of symptoms. . Outlined signs and symptoms indicating need for more acute intervention. . Patient verbalized understanding and all questions were answered. . See orders for this visit as documented in the electronic medical record. . Patient received an After-Visit Summary.   CMA or LPN served as scribe during this visit. History, Physical, and Plan performed by medical provider. Documentation and orders reviewed and attested to.   Inda Coke, PA-C

## 2017-08-21 NOTE — Patient Instructions (Signed)
It was great to meet you!  Please make an appointment with the lab on your way out. I would like for you to return for lab work within 1 week. After midnight on the day of the lab draw, please do not eat anything. You may have water, black coffee, unsweetened tea.  Please call and make an appointment with Trey Paula, the therapist here in our office.  While we are waiting to get your labs done, think about starting either Zoloft or Lexapro  Escitalopram tablets What is this medicine? ESCITALOPRAM (es sye TAL oh pram) is used to treat depression and certain types of anxiety. This medicine may be used for other purposes; ask your health care provider or pharmacist if you have questions. COMMON BRAND NAME(S): Lexapro What should I tell my health care provider before I take this medicine? They need to know if you have any of these conditions: -bipolar disorder or a family history of bipolar disorder -diabetes -glaucoma -heart disease -kidney or liver disease -receiving electroconvulsive therapy -seizures (convulsions) -suicidal thoughts, plans, or attempt by you or a family member -an unusual or allergic reaction to escitalopram, the related drug citalopram, other medicines, foods, dyes, or preservatives -pregnant or trying to become pregnant -breast-feeding How should I use this medicine? Take this medicine by mouth with a glass of water. Follow the directions on the prescription label. You can take it with or without food. If it upsets your stomach, take it with food. Take your medicine at regular intervals. Do not take it more often than directed. Do not stop taking this medicine suddenly except upon the advice of your doctor. Stopping this medicine too quickly may cause serious side effects or your condition may worsen. A special MedGuide will be given to you by the pharmacist with each prescription and refill. Be sure to read this information carefully each time. Talk to your  pediatrician regarding the use of this medicine in children. Special care may be needed. Overdosage: If you think you have taken too much of this medicine contact a poison control center or emergency room at once. NOTE: This medicine is only for you. Do not share this medicine with others. What if I miss a dose? If you miss a dose, take it as soon as you can. If it is almost time for your next dose, take only that dose. Do not take double or extra doses. What may interact with this medicine? Do not take this medicine with any of the following medications: -certain medicines for fungal infections like fluconazole, itraconazole, ketoconazole, posaconazole, voriconazole -cisapride -citalopram -dofetilide -dronedarone -linezolid -MAOIs like Carbex, Eldepryl, Marplan, Nardil, and Parnate -methylene blue (injected into a vein) -pimozide -thioridazine -ziprasidone This medicine may also interact with the following medications: -alcohol -amphetamines -aspirin and aspirin-like medicines -carbamazepine -certain medicines for depression, anxiety, or psychotic disturbances -certain medicines for migraine headache like almotriptan, eletriptan, frovatriptan, naratriptan, rizatriptan, sumatriptan, zolmitriptan -certain medicines for sleep -certain medicines that treat or prevent blood clots like warfarin, enoxaparin, dalteparin -cimetidine -diuretics -fentanyl -furazolidone -isoniazid -lithium -metoprolol -NSAIDs, medicines for pain and inflammation, like ibuprofen or naproxen -other medicines that prolong the QT interval (cause an abnormal heart rhythm) -procarbazine -rasagiline -supplements like St. John's wort, kava kava, valerian -tramadol -tryptophan This list may not describe all possible interactions. Give your health care provider a list of all the medicines, herbs, non-prescription drugs, or dietary supplements you use. Also tell them if you smoke, drink alcohol, or use illegal  drugs. Some items may  interact with your medicine. What should I watch for while using this medicine? Tell your doctor if your symptoms do not get better or if they get worse. Visit your doctor or health care professional for regular checks on your progress. Because it may take several weeks to see the full effects of this medicine, it is important to continue your treatment as prescribed by your doctor. Patients and their families should watch out for new or worsening thoughts of suicide or depression. Also watch out for sudden changes in feelings such as feeling anxious, agitated, panicky, irritable, hostile, aggressive, impulsive, severely restless, overly excited and hyperactive, or not being able to sleep. If this happens, especially at the beginning of treatment or after a change in dose, call your health care professional. Dennis Bast may get drowsy or dizzy. Do not drive, use machinery, or do anything that needs mental alertness until you know how this medicine affects you. Do not stand or sit up quickly, especially if you are an older patient. This reduces the risk of dizzy or fainting spells. Alcohol may interfere with the effect of this medicine. Avoid alcoholic drinks. Your mouth may get dry. Chewing sugarless gum or sucking hard candy, and drinking plenty of water may help. Contact your doctor if the problem does not go away or is severe. What side effects may I notice from receiving this medicine? Side effects that you should report to your doctor or health care professional as soon as possible: -allergic reactions like skin rash, itching or hives, swelling of the face, lips, or tongue -anxious -black, tarry stools -changes in vision -confusion -elevated mood, decreased need for sleep, racing thoughts, impulsive behavior -eye pain -fast, irregular heartbeat -feeling faint or lightheaded, falls -feeling agitated, angry, or irritable -hallucination, loss of contact with reality -loss of  balance or coordination -loss of memory -painful or prolonged erections -restlessness, pacing, inability to keep still -seizures -stiff muscles -suicidal thoughts or other mood changes -trouble sleeping -unusual bleeding or bruising -unusually weak or tired -vomiting Side effects that usually do not require medical attention (report to your doctor or health care professional if they continue or are bothersome): -changes in appetite -change in sex drive or performance -headache -increased sweating -indigestion, nausea -tremors This list may not describe all possible side effects. Call your doctor for medical advice about side effects. You may report side effects to FDA at 1-800-FDA-1088. Where should I keep my medicine? Keep out of reach of children. Store at room temperature between 15 and 30 degrees C (59 and 86 degrees F). Throw away any unused medicine after the expiration date. NOTE: This sheet is a summary. It may not cover all possible information. If you have questions about this medicine, talk to your doctor, pharmacist, or health care provider.  2018 Elsevier/Gold Standard (2016-04-07 13:20:23)  Sertraline tablets What is this medicine? SERTRALINE (SER tra leen) is used to treat depression. It may also be used to treat obsessive compulsive disorder, panic disorder, post-trauma stress, premenstrual dysphoric disorder (PMDD) or social anxiety. This medicine may be used for other purposes; ask your health care provider or pharmacist if you have questions. COMMON BRAND NAME(S): Zoloft What should I tell my health care provider before I take this medicine? They need to know if you have any of these conditions: -bleeding disorders -bipolar disorder or a family history of bipolar disorder -glaucoma -heart disease -high blood pressure -history of irregular heartbeat -history of low levels of calcium, magnesium, or potassium in the blood -if  you often drink alcohol -liver  disease -receiving electroconvulsive therapy -seizures -suicidal thoughts, plans, or attempt; a previous suicide attempt by you or a family member -take medicines that treat or prevent blood clots -thyroid disease -an unusual or allergic reaction to sertraline, other medicines, foods, dyes, or preservatives -pregnant or trying to get pregnant -breast-feeding How should I use this medicine? Take this medicine by mouth with a glass of water. Follow the directions on the prescription label. You can take it with or without food. Take your medicine at regular intervals. Do not take your medicine more often than directed. Do not stop taking this medicine suddenly except upon the advice of your doctor. Stopping this medicine too quickly may cause serious side effects or your condition may worsen. A special MedGuide will be given to you by the pharmacist with each prescription and refill. Be sure to read this information carefully each time. Talk to your pediatrician regarding the use of this medicine in children. While this drug may be prescribed for children as young as 7 years for selected conditions, precautions do apply. Overdosage: If you think you have taken too much of this medicine contact a poison control center or emergency room at once. NOTE: This medicine is only for you. Do not share this medicine with others. What if I miss a dose? If you miss a dose, take it as soon as you can. If it is almost time for your next dose, take only that dose. Do not take double or extra doses. What may interact with this medicine? Do not take this medicine with any of the following medications: -cisapride -dofetilide -dronedarone -linezolid -MAOIs like Carbex, Eldepryl, Marplan, Nardil, and Parnate -methylene blue (injected into a vein) -pimozide -thioridazine This medicine may also interact with the following medications: -alcohol -amphetamines -aspirin and aspirin-like medicines -certain  medicines for depression, anxiety, or psychotic disturbances -certain medicines for fungal infections like ketoconazole, fluconazole, posaconazole, and itraconazole -certain medicines for irregular heart beat like flecainide, quinidine, propafenone -certain medicines for migraine headaches like almotriptan, eletriptan, frovatriptan, naratriptan, rizatriptan, sumatriptan, zolmitriptan -certain medicines for sleep -certain medicines for seizures like carbamazepine, valproic acid, phenytoin -certain medicines that treat or prevent blood clots like warfarin, enoxaparin, dalteparin -cimetidine -digoxin -diuretics -fentanyl -isoniazid -lithium -NSAIDs, medicines for pain and inflammation, like ibuprofen or naproxen -other medicines that prolong the QT interval (cause an abnormal heart rhythm) -rasagiline -safinamide -supplements like St. John's wort, kava kava, valerian -tolbutamide -tramadol -tryptophan This list may not describe all possible interactions. Give your health care provider a list of all the medicines, herbs, non-prescription drugs, or dietary supplements you use. Also tell them if you smoke, drink alcohol, or use illegal drugs. Some items may interact with your medicine. What should I watch for while using this medicine? Tell your doctor if your symptoms do not get better or if they get worse. Visit your doctor or health care professional for regular checks on your progress. Because it may take several weeks to see the full effects of this medicine, it is important to continue your treatment as prescribed by your doctor. Patients and their families should watch out for new or worsening thoughts of suicide or depression. Also watch out for sudden changes in feelings such as feeling anxious, agitated, panicky, irritable, hostile, aggressive, impulsive, severely restless, overly excited and hyperactive, or not being able to sleep. If this happens, especially at the beginning of  treatment or after a change in dose, call your health care professional. You may  get drowsy or dizzy. Do not drive, use machinery, or do anything that needs mental alertness until you know how this medicine affects you. Do not stand or sit up quickly, especially if you are an older patient. This reduces the risk of dizzy or fainting spells. Alcohol may interfere with the effect of this medicine. Avoid alcoholic drinks. Your mouth may get dry. Chewing sugarless gum or sucking hard candy, and drinking plenty of water may help. Contact your doctor if the problem does not go away or is severe. What side effects may I notice from receiving this medicine? Side effects that you should report to your doctor or health care professional as soon as possible: -allergic reactions like skin rash, itching or hives, swelling of the face, lips, or tongue -anxious -black, tarry stools -changes in vision -confusion -elevated mood, decreased need for sleep, racing thoughts, impulsive behavior -eye pain -fast, irregular heartbeat -feeling faint or lightheaded, falls -feeling agitated, angry, or irritable -hallucination, loss of contact with reality -loss of balance or coordination -loss of memory -painful or prolonged erections -restlessness, pacing, inability to keep still -seizures -stiff muscles -suicidal thoughts or other mood changes -trouble sleeping -unusual bleeding or bruising -unusually weak or tired -vomiting Side effects that usually do not require medical attention (report to your doctor or health care professional if they continue or are bothersome): -change in appetite or weight -change in sex drive or performance -diarrhea -increased sweating -indigestion, nausea -tremors This list may not describe all possible side effects. Call your doctor for medical advice about side effects. You may report side effects to FDA at 1-800-FDA-1088. Where should I keep my medicine? Keep out of the  reach of children. Store at room temperature between 15 and 30 degrees C (59 and 86 degrees F). Throw away any unused medicine after the expiration date. NOTE: This sheet is a summary. It may not cover all possible information. If you have questions about this medicine, talk to your doctor, pharmacist, or health care provider.  2018 Elsevier/Gold Standard (2016-11-07 14:17:49)  Health Maintenance, Female Adopting a healthy lifestyle and getting preventive care can go a long way to promote health and wellness. Talk with your health care provider about what schedule of regular examinations is right for you. This is a good chance for you to check in with your provider about disease prevention and staying healthy. In between checkups, there are plenty of things you can do on your own. Experts have done a lot of research about which lifestyle changes and preventive measures are most likely to keep you healthy. Ask your health care provider for more information. Weight and diet Eat a healthy diet  Be sure to include plenty of vegetables, fruits, low-fat dairy products, and lean protein.  Do not eat a lot of foods high in solid fats, added sugars, or salt.  Get regular exercise. This is one of the most important things you can do for your health. ? Most adults should exercise for at least 150 minutes each week. The exercise should increase your heart rate and make you sweat (moderate-intensity exercise). ? Most adults should also do strengthening exercises at least twice a week. This is in addition to the moderate-intensity exercise.  Maintain a healthy weight  Body mass index (BMI) is a measurement that can be used to identify possible weight problems. It estimates body fat based on height and weight. Your health care provider can help determine your BMI and help you achieve or maintain a healthy  weight.  For females 34 years of age and older: ? A BMI below 18.5 is considered underweight. ? A BMI  of 18.5 to 24.9 is normal. ? A BMI of 25 to 29.9 is considered overweight. ? A BMI of 30 and above is considered obese.  Watch levels of cholesterol and blood lipids  You should start having your blood tested for lipids and cholesterol at 49 years of age, then have this test every 5 years.  You may need to have your cholesterol levels checked more often if: ? Your lipid or cholesterol levels are high. ? You are older than 49 years of age. ? You are at high risk for heart disease.  Cancer screening Lung Cancer  Lung cancer screening is recommended for adults 16-23 years old who are at high risk for lung cancer because of a history of smoking.  A yearly low-dose CT scan of the lungs is recommended for people who: ? Currently smoke. ? Have quit within the past 15 years. ? Have at least a 30-pack-year history of smoking. A pack year is smoking an average of one pack of cigarettes a day for 1 year.  Yearly screening should continue until it has been 15 years since you quit.  Yearly screening should stop if you develop a health problem that would prevent you from having lung cancer treatment.  Breast Cancer  Practice breast self-awareness. This means understanding how your breasts normally appear and feel.  It also means doing regular breast self-exams. Let your health care provider know about any changes, no matter how small.  If you are in your 20s or 30s, you should have a clinical breast exam (CBE) by a health care provider every 1-3 years as part of a regular health exam.  If you are 92 or older, have a CBE every year. Also consider having a breast X-ray (mammogram) every year.  If you have a family history of breast cancer, talk to your health care provider about genetic screening.  If you are at high risk for breast cancer, talk to your health care provider about having an MRI and a mammogram every year.  Breast cancer gene (BRCA) assessment is recommended for women who have  family members with BRCA-related cancers. BRCA-related cancers include: ? Breast. ? Ovarian. ? Tubal. ? Peritoneal cancers.  Results of the assessment will determine the need for genetic counseling and BRCA1 and BRCA2 testing.  Cervical Cancer Your health care provider may recommend that you be screened regularly for cancer of the pelvic organs (ovaries, uterus, and vagina). This screening involves a pelvic examination, including checking for microscopic changes to the surface of your cervix (Pap test). You may be encouraged to have this screening done every 3 years, beginning at age 57.  For women ages 72-65, health care providers may recommend pelvic exams and Pap testing every 3 years, or they may recommend the Pap and pelvic exam, combined with testing for human papilloma virus (HPV), every 5 years. Some types of HPV increase your risk of cervical cancer. Testing for HPV may also be done on women of any age with unclear Pap test results.  Other health care providers may not recommend any screening for nonpregnant women who are considered low risk for pelvic cancer and who do not have symptoms. Ask your health care provider if a screening pelvic exam is right for you.  If you have had past treatment for cervical cancer or a condition that could lead to cancer, you  need Pap tests and screening for cancer for at least 20 years after your treatment. If Pap tests have been discontinued, your risk factors (such as having a new sexual partner) need to be reassessed to determine if screening should resume. Some women have medical problems that increase the chance of getting cervical cancer. In these cases, your health care provider may recommend more frequent screening and Pap tests.  Colorectal Cancer  This type of cancer can be detected and often prevented.  Routine colorectal cancer screening usually begins at 49 years of age and continues through 49 years of age.  Your health care provider may  recommend screening at an earlier age if you have risk factors for colon cancer.  Your health care provider may also recommend using home test kits to check for hidden blood in the stool.  A small camera at the end of a tube can be used to examine your colon directly (sigmoidoscopy or colonoscopy). This is done to check for the earliest forms of colorectal cancer.  Routine screening usually begins at age 32.  Direct examination of the colon should be repeated every 5-10 years through 49 years of age. However, you may need to be screened more often if early forms of precancerous polyps or small growths are found.  Skin Cancer  Check your skin from head to toe regularly.  Tell your health care provider about any new moles or changes in moles, especially if there is a change in a mole's shape or color.  Also tell your health care provider if you have a mole that is larger than the size of a pencil eraser.  Always use sunscreen. Apply sunscreen liberally and repeatedly throughout the day.  Protect yourself by wearing long sleeves, pants, a wide-brimmed hat, and sunglasses whenever you are outside.  Heart disease, diabetes, and high blood pressure  High blood pressure causes heart disease and increases the risk of stroke. High blood pressure is more likely to develop in: ? People who have blood pressure in the high end of the normal range (130-139/85-89 mm Hg). ? People who are overweight or obese. ? People who are African American.  If you are 25-2 years of age, have your blood pressure checked every 3-5 years. If you are 76 years of age or older, have your blood pressure checked every year. You should have your blood pressure measured twice-once when you are at a hospital or clinic, and once when you are not at a hospital or clinic. Record the average of the two measurements. To check your blood pressure when you are not at a hospital or clinic, you can use: ? An automated blood pressure  machine at a pharmacy. ? A home blood pressure monitor.  If you are between 37 years and 35 years old, ask your health care provider if you should take aspirin to prevent strokes.  Have regular diabetes screenings. This involves taking a blood sample to check your fasting blood sugar level. ? If you are at a normal weight and have a low risk for diabetes, have this test once every three years after 49 years of age. ? If you are overweight and have a high risk for diabetes, consider being tested at a younger age or more often. Preventing infection Hepatitis B  If you have a higher risk for hepatitis B, you should be screened for this virus. You are considered at high risk for hepatitis B if: ? You were born in a country where hepatitis  B is common. Ask your health care provider which countries are considered high risk. ? Your parents were born in a high-risk country, and you have not been immunized against hepatitis B (hepatitis B vaccine). ? You have HIV or AIDS. ? You use needles to inject street drugs. ? You live with someone who has hepatitis B. ? You have had sex with someone who has hepatitis B. ? You get hemodialysis treatment. ? You take certain medicines for conditions, including cancer, organ transplantation, and autoimmune conditions.  Hepatitis C  Blood testing is recommended for: ? Everyone born from [birth year] through 1963. ? Anyone with known risk factors for hepatitis C.  Sexually transmitted infections (STIs)  You should be screened for sexually transmitted infections (STIs) including gonorrhea and chlamydia if: ? You are sexually active and are younger than 49 years of age. ? You are older than 49 years of age and your health care provider tells you that you are at risk for this type of infection. ? Your sexual activity has changed since you were last screened and you are at an increased risk for chlamydia or gonorrhea. Ask your health care provider if you are at  risk.  If you do not have HIV, but are at risk, it may be recommended that you take a prescription medicine daily to prevent HIV infection. This is called pre-exposure prophylaxis (PrEP). You are considered at risk if: ? You are sexually active and do not regularly use condoms or know the HIV status of your partner(s). ? You take drugs by injection. ? You are sexually active with a partner who has HIV.  Talk with your health care provider about whether you are at high risk of being infected with HIV. If you choose to begin PrEP, you should first be tested for HIV. You should then be tested every 3 months for as long as you are taking PrEP. Pregnancy  If you are premenopausal and you may become pregnant, ask your health care provider about preconception counseling.  If you may become pregnant, take 400 to 800 micrograms (mcg) of folic acid every day.  If you want to prevent pregnancy, talk to your health care provider about birth control (contraception). Osteoporosis and menopause  Osteoporosis is a disease in which the bones lose minerals and strength with aging. This can result in serious bone fractures. Your risk for osteoporosis can be identified using a bone density scan.  If you are 47 years of age or older, or if you are at risk for osteoporosis and fractures, ask your health care provider if you should be screened.  Ask your health care provider whether you should take a calcium or vitamin D supplement to lower your risk for osteoporosis.  Menopause may have certain physical symptoms and risks.  Hormone replacement therapy may reduce some of these symptoms and risks. Talk to your health care provider about whether hormone replacement therapy is right for you. Follow these instructions at home:  Schedule regular health, dental, and eye exams.  Stay current with your immunizations.  Do not use any tobacco products including cigarettes, chewing tobacco, or electronic  cigarettes.  If you are pregnant, do not drink alcohol.  If you are breastfeeding, limit how much and how often you drink alcohol.  Limit alcohol intake to no more than 1 drink per day for nonpregnant women. One drink equals 12 ounces of beer, 5 ounces of wine, or 1 ounces of hard liquor.  Do not use street drugs.  Do not share needles.  Ask your health care provider for help if you need support or information about quitting drugs.  Tell your health care provider if you often feel depressed.  Tell your health care provider if you have ever been abused or do not feel safe at home. This information is not intended to replace advice given to you by your health care provider. Make sure you discuss any questions you have with your health care provider. Document Released: 05/19/2011 Document Revised: 04/10/2016 Document Reviewed: 08/07/2015 Elsevier Interactive Patient Education  [employer].

## 2017-08-25 ENCOUNTER — Other Ambulatory Visit (INDEPENDENT_AMBULATORY_CARE_PROVIDER_SITE_OTHER): Payer: BLUE CROSS/BLUE SHIELD

## 2017-08-25 DIAGNOSIS — R7989 Other specified abnormal findings of blood chemistry: Secondary | ICD-10-CM | POA: Diagnosis not present

## 2017-08-25 DIAGNOSIS — Z13220 Encounter for screening for lipoid disorders: Secondary | ICD-10-CM | POA: Diagnosis not present

## 2017-08-25 DIAGNOSIS — M25671 Stiffness of right ankle, not elsewhere classified: Secondary | ICD-10-CM | POA: Diagnosis not present

## 2017-08-25 DIAGNOSIS — M25572 Pain in left ankle and joints of left foot: Secondary | ICD-10-CM | POA: Diagnosis not present

## 2017-08-25 DIAGNOSIS — Z0001 Encounter for general adult medical examination with abnormal findings: Secondary | ICD-10-CM

## 2017-08-25 DIAGNOSIS — M25672 Stiffness of left ankle, not elsewhere classified: Secondary | ICD-10-CM | POA: Diagnosis not present

## 2017-08-25 DIAGNOSIS — M25571 Pain in right ankle and joints of right foot: Secondary | ICD-10-CM | POA: Diagnosis not present

## 2017-08-25 DIAGNOSIS — F418 Other specified anxiety disorders: Secondary | ICD-10-CM

## 2017-08-25 LAB — CBC WITH DIFFERENTIAL/PLATELET
BASOS PCT: 0.5 % (ref 0.0–3.0)
Basophils Absolute: 0 10*3/uL (ref 0.0–0.1)
EOS PCT: 2.2 % (ref 0.0–5.0)
Eosinophils Absolute: 0.1 10*3/uL (ref 0.0–0.7)
HEMATOCRIT: 42.9 % (ref 36.0–46.0)
Hemoglobin: 14.2 g/dL (ref 12.0–15.0)
LYMPHS ABS: 1.9 10*3/uL (ref 0.7–4.0)
Lymphocytes Relative: 31.7 % (ref 12.0–46.0)
MCHC: 33.2 g/dL (ref 30.0–36.0)
MCV: 91.4 fl (ref 78.0–100.0)
Monocytes Absolute: 0.4 10*3/uL (ref 0.1–1.0)
Monocytes Relative: 7.1 % (ref 3.0–12.0)
NEUTROS ABS: 3.4 10*3/uL (ref 1.4–7.7)
NEUTROS PCT: 58.5 % (ref 43.0–77.0)
PLATELETS: 222 10*3/uL (ref 150.0–400.0)
RBC: 4.69 Mil/uL (ref 3.87–5.11)
RDW: 13.1 % (ref 11.5–15.5)
WBC: 5.9 10*3/uL (ref 4.0–10.5)

## 2017-08-25 LAB — COMPREHENSIVE METABOLIC PANEL
ALK PHOS: 111 U/L (ref 39–117)
ALT: 17 U/L (ref 0–35)
AST: 11 U/L (ref 0–37)
Albumin: 4.3 g/dL (ref 3.5–5.2)
BUN: 17 mg/dL (ref 6–23)
CALCIUM: 10.3 mg/dL (ref 8.4–10.5)
CO2: 25 meq/L (ref 19–32)
Chloride: 106 mEq/L (ref 96–112)
Creatinine, Ser: 0.76 mg/dL (ref 0.40–1.20)
GFR: 86.03 mL/min (ref >60.00)
GLUCOSE: 87 mg/dL (ref 70–99)
POTASSIUM: 4.2 meq/L (ref 3.5–5.1)
Sodium: 141 mEq/L (ref 135–145)
Total Bilirubin: 0.4 mg/dL (ref 0.2–1.2)
Total Protein: 6.8 g/dL (ref 6.0–8.3)

## 2017-08-25 LAB — T4, FREE: Free T4: 0.87 ng/dL (ref 0.60–1.60)

## 2017-08-25 LAB — LIPID PANEL
CHOL/HDL RATIO: 3
Cholesterol: 176 mg/dL (ref 0–200)
HDL: 57.1 mg/dL (ref >39.00)
LDL Cholesterol: 101 mg/dL — ABNORMAL HIGH (ref 0–99)
NONHDL: 119.26
TRIGLYCERIDES: 92 mg/dL (ref 0.0–149.0)
VLDL: 18.4 mg/dL (ref 0.0–40.0)

## 2017-08-25 LAB — TSH: TSH: 8.34 u[IU]/mL — ABNORMAL HIGH (ref 0.35–4.50)

## 2017-08-25 NOTE — Addendum Note (Signed)
Addended by: Erlene Quan on: 08/25/2017 01:09 PM   Modules accepted: Orders

## 2017-08-26 ENCOUNTER — Telehealth: Payer: Self-pay | Admitting: Physician Assistant

## 2017-08-26 NOTE — Telephone Encounter (Signed)
See result notes. 

## 2017-08-26 NOTE — Telephone Encounter (Signed)
Patient called to get lab results. Call patient to advise on mobile, okay to leave a detailed message on phone.

## 2017-08-27 ENCOUNTER — Other Ambulatory Visit: Payer: Self-pay | Admitting: Physician Assistant

## 2017-08-27 DIAGNOSIS — M25672 Stiffness of left ankle, not elsewhere classified: Secondary | ICD-10-CM | POA: Diagnosis not present

## 2017-08-27 DIAGNOSIS — M25671 Stiffness of right ankle, not elsewhere classified: Secondary | ICD-10-CM | POA: Diagnosis not present

## 2017-08-27 DIAGNOSIS — M25571 Pain in right ankle and joints of right foot: Secondary | ICD-10-CM | POA: Diagnosis not present

## 2017-08-27 DIAGNOSIS — M25572 Pain in left ankle and joints of left foot: Secondary | ICD-10-CM | POA: Diagnosis not present

## 2017-08-27 MED ORDER — ESCITALOPRAM OXALATE 10 MG PO TABS: 10.0000 mg | ORAL_TABLET | Freq: Every day | ORAL | 1 refills | Status: DC

## 2017-08-31 ENCOUNTER — Telehealth: Payer: Self-pay | Admitting: Physician Assistant

## 2017-08-31 NOTE — Telephone Encounter (Signed)
error 

## 2017-09-01 DIAGNOSIS — M25672 Stiffness of left ankle, not elsewhere classified: Secondary | ICD-10-CM | POA: Diagnosis not present

## 2017-09-01 DIAGNOSIS — M25572 Pain in left ankle and joints of left foot: Secondary | ICD-10-CM | POA: Diagnosis not present

## 2017-09-01 DIAGNOSIS — M25671 Stiffness of right ankle, not elsewhere classified: Secondary | ICD-10-CM | POA: Diagnosis not present

## 2017-09-01 DIAGNOSIS — M25571 Pain in right ankle and joints of right foot: Secondary | ICD-10-CM | POA: Diagnosis not present

## 2017-09-02 ENCOUNTER — Telehealth: Payer: Self-pay | Admitting: Physician Assistant

## 2017-09-02 NOTE — Telephone Encounter (Signed)
ROI faxed to [facility]

## 2017-09-03 DIAGNOSIS — M25672 Stiffness of left ankle, not elsewhere classified: Secondary | ICD-10-CM | POA: Diagnosis not present

## 2017-09-03 DIAGNOSIS — M25571 Pain in right ankle and joints of right foot: Secondary | ICD-10-CM | POA: Diagnosis not present

## 2017-09-03 DIAGNOSIS — M25671 Stiffness of right ankle, not elsewhere classified: Secondary | ICD-10-CM | POA: Diagnosis not present

## 2017-09-03 DIAGNOSIS — M25572 Pain in left ankle and joints of left foot: Secondary | ICD-10-CM | POA: Diagnosis not present

## 2017-09-09 DIAGNOSIS — M25571 Pain in right ankle and joints of right foot: Secondary | ICD-10-CM | POA: Diagnosis not present

## 2017-09-09 DIAGNOSIS — M25572 Pain in left ankle and joints of left foot: Secondary | ICD-10-CM | POA: Diagnosis not present

## 2017-09-09 DIAGNOSIS — M25671 Stiffness of right ankle, not elsewhere classified: Secondary | ICD-10-CM | POA: Diagnosis not present

## 2017-09-09 DIAGNOSIS — M25672 Stiffness of left ankle, not elsewhere classified: Secondary | ICD-10-CM | POA: Diagnosis not present

## 2017-09-11 DIAGNOSIS — M25672 Stiffness of left ankle, not elsewhere classified: Secondary | ICD-10-CM | POA: Diagnosis not present

## 2017-09-11 DIAGNOSIS — M25571 Pain in right ankle and joints of right foot: Secondary | ICD-10-CM | POA: Diagnosis not present

## 2017-09-11 DIAGNOSIS — M25572 Pain in left ankle and joints of left foot: Secondary | ICD-10-CM | POA: Diagnosis not present

## 2017-09-11 DIAGNOSIS — M25671 Stiffness of right ankle, not elsewhere classified: Secondary | ICD-10-CM | POA: Diagnosis not present

## 2017-09-15 DIAGNOSIS — M25572 Pain in left ankle and joints of left foot: Secondary | ICD-10-CM | POA: Diagnosis not present

## 2017-09-15 DIAGNOSIS — M25671 Stiffness of right ankle, not elsewhere classified: Secondary | ICD-10-CM | POA: Diagnosis not present

## 2017-09-15 DIAGNOSIS — M25672 Stiffness of left ankle, not elsewhere classified: Secondary | ICD-10-CM | POA: Diagnosis not present

## 2017-09-15 DIAGNOSIS — M25571 Pain in right ankle and joints of right foot: Secondary | ICD-10-CM | POA: Diagnosis not present

## 2017-09-17 DIAGNOSIS — M25672 Stiffness of left ankle, not elsewhere classified: Secondary | ICD-10-CM | POA: Diagnosis not present

## 2017-09-17 DIAGNOSIS — M25671 Stiffness of right ankle, not elsewhere classified: Secondary | ICD-10-CM | POA: Diagnosis not present

## 2017-09-17 DIAGNOSIS — M25572 Pain in left ankle and joints of left foot: Secondary | ICD-10-CM | POA: Diagnosis not present

## 2017-09-17 DIAGNOSIS — M25571 Pain in right ankle and joints of right foot: Secondary | ICD-10-CM | POA: Diagnosis not present

## 2017-09-21 ENCOUNTER — Ambulatory Visit: Payer: BLUE CROSS/BLUE SHIELD | Admitting: Physician Assistant

## 2017-09-21 DIAGNOSIS — Z0289 Encounter for other administrative examinations: Secondary | ICD-10-CM

## 2017-09-21 NOTE — Progress Notes (Deleted)
Jenna Pierce is a 51 y.o. female is here to follow up on anxiety.  I acted as a Education administrator for [employer], PA-C [employer], LPN  History of Present Illness:   No chief complaint on file.   Anxiety  Presents for follow-up visit.      Health Maintenance Due  Topic Date Due  . HIV Screening  10/29/1983  . MAMMOGRAM  10/28/1986  . TETANUS/TDAP  10/29/1987    Past Medical History:  Diagnosis Date  . Ovarian cyst      Social History   Socioeconomic History  . Marital status: Married    Spouse name: Not on file  . Number of children: Not on file  . Years of education: Not on file  . Highest education level: Not on file  Social Needs  . Financial resource strain: Not on file  . Food insecurity - worry: Not on file  . Food insecurity - inability: Not on file  . Transportation needs - medical: Not on file  . Transportation needs - non-medical: Not on file  Occupational History  . Not on file  Tobacco Use  . Smoking status: Never Smoker  . Smokeless tobacco: Never Used  Substance and Sexual Activity  . Alcohol use: Yes    Comment: 3 drinks a week wine or beer  . Drug use: No  . Sexual activity: Yes  Other Topics Concern  . 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 ABDOMINAL HYSTERECTOMY    . BUNIONECTOMY Bilateral 2017   done 4 weeks apart  . distal radius repair Left 2016  . FRACTURE SURGERY    . pin and repair 5th metacarpal  2017  . ROBOTIC ASSISTED TOTAL HYSTERECTOMY WITH BILATERAL SALPINGO OOPHERECTOMY  08/24/2015   Performed by Dr. Nancy Marus at [facility]  . WRIST FRACTURE SURGERY Left 02/26/2015   hardware and screws placed    Family History  Problem Relation Age of Onset  . Diabetes Father   . Arthritis Father   . Hyperlipidemia Father   . Hypertension Father   . Heart attack  Father   . Breast cancer Paternal Grandmother   . Arthritis Mother   . COPD Mother   . Depression Mother   . Alzheimer's disease Mother   . Alcohol abuse Maternal Grandfather     PMHx, SurgHx, SocialHx, FamHx, Medications, and Allergies were reviewed in the Visit Navigator and updated as appropriate.   Patient Active Problem List   Diagnosis Date Noted  . KNEE PAIN, RIGHT 01/23/2011  . BUNION 01/23/2011  . TALIPES CAVUS 01/23/2011    Social History   Tobacco Use  . Smoking status: Never Smoker  . Smokeless tobacco: Never Used  Substance Use Topics  . Alcohol use: Yes    Comment: 3 drinks a week wine or beer  . Drug use: No    Current Medications and Allergies:    Current Outpatient Medications:  .  escitalopram (LEXAPRO) 10 MG tablet, Take 1 tablet (10 mg total) by mouth daily., Disp: 30 tablet, Rfl: 1 .  estradiol (CLIMARA - DOSED IN MG/24 HR) 0.05 mg/24hr patch, APPLY 1 PATCH WEEKLY AS DIRECTED, Disp: , Rfl: 4  No Known Allergies  Review of Systems   ROS  Vitals:  There were no vitals filed for this  visit.   There is no height or weight on file to calculate BMI.   Physical Exam:    Physical Exam   Assessment and Plan:    There are no diagnoses linked to this encounter.  . Reviewed expectations re: course of current medical issues. . Discussed self-management of symptoms. . Outlined signs and symptoms indicating need for more acute intervention. . Patient verbalized understanding and all questions were answered. . See orders for this visit as documented in the electronic medical record. . Patient received an After Visit Summary.  CMA or LPN served as scribe during this visit. History, Physical, and Plan performed by medical provider. Documentation and orders reviewed and attested to.  Inda Coke, PA-C [place], [facility] 09/21/2017  Follow-up: No Follow-up on file.

## 2017-09-22 DIAGNOSIS — M25672 Stiffness of left ankle, not elsewhere classified: Secondary | ICD-10-CM | POA: Diagnosis not present

## 2017-09-22 DIAGNOSIS — M25671 Stiffness of right ankle, not elsewhere classified: Secondary | ICD-10-CM | POA: Diagnosis not present

## 2017-09-22 DIAGNOSIS — M25571 Pain in right ankle and joints of right foot: Secondary | ICD-10-CM | POA: Diagnosis not present

## 2017-09-22 DIAGNOSIS — M25572 Pain in left ankle and joints of left foot: Secondary | ICD-10-CM | POA: Diagnosis not present

## 2017-09-24 DIAGNOSIS — M25572 Pain in left ankle and joints of left foot: Secondary | ICD-10-CM | POA: Diagnosis not present

## 2017-09-24 DIAGNOSIS — M25671 Stiffness of right ankle, not elsewhere classified: Secondary | ICD-10-CM | POA: Diagnosis not present

## 2017-09-24 DIAGNOSIS — M25672 Stiffness of left ankle, not elsewhere classified: Secondary | ICD-10-CM | POA: Diagnosis not present

## 2017-09-24 DIAGNOSIS — M25571 Pain in right ankle and joints of right foot: Secondary | ICD-10-CM | POA: Diagnosis not present

## 2017-09-25 ENCOUNTER — Telehealth: Payer: Self-pay | Admitting: Physician Assistant

## 2017-09-25 NOTE — Telephone Encounter (Signed)
Received from [facility] [address] to Dr Inda Coke.

## 2017-09-29 DIAGNOSIS — M25572 Pain in left ankle and joints of left foot: Secondary | ICD-10-CM | POA: Diagnosis not present

## 2017-09-29 DIAGNOSIS — M25671 Stiffness of right ankle, not elsewhere classified: Secondary | ICD-10-CM | POA: Diagnosis not present

## 2017-09-29 DIAGNOSIS — M25672 Stiffness of left ankle, not elsewhere classified: Secondary | ICD-10-CM | POA: Diagnosis not present

## 2017-09-29 DIAGNOSIS — M25571 Pain in right ankle and joints of right foot: Secondary | ICD-10-CM | POA: Diagnosis not present

## 2017-09-30 ENCOUNTER — Encounter: Payer: Self-pay | Admitting: Physician Assistant

## 2017-10-01 DIAGNOSIS — M25572 Pain in left ankle and joints of left foot: Secondary | ICD-10-CM | POA: Diagnosis not present

## 2017-10-01 DIAGNOSIS — M25671 Stiffness of right ankle, not elsewhere classified: Secondary | ICD-10-CM | POA: Diagnosis not present

## 2017-10-01 DIAGNOSIS — M25672 Stiffness of left ankle, not elsewhere classified: Secondary | ICD-10-CM | POA: Diagnosis not present

## 2017-10-01 DIAGNOSIS — M25571 Pain in right ankle and joints of right foot: Secondary | ICD-10-CM | POA: Diagnosis not present

## 2017-10-04 DIAGNOSIS — M25572 Pain in left ankle and joints of left foot: Secondary | ICD-10-CM | POA: Diagnosis not present

## 2017-10-04 DIAGNOSIS — M25671 Stiffness of right ankle, not elsewhere classified: Secondary | ICD-10-CM | POA: Diagnosis not present

## 2017-10-04 DIAGNOSIS — M25571 Pain in right ankle and joints of right foot: Secondary | ICD-10-CM | POA: Diagnosis not present

## 2017-10-04 DIAGNOSIS — M25672 Stiffness of left ankle, not elsewhere classified: Secondary | ICD-10-CM | POA: Diagnosis not present

## 2017-10-06 DIAGNOSIS — M25571 Pain in right ankle and joints of right foot: Secondary | ICD-10-CM | POA: Diagnosis not present

## 2017-10-06 DIAGNOSIS — M25672 Stiffness of left ankle, not elsewhere classified: Secondary | ICD-10-CM | POA: Diagnosis not present

## 2017-10-06 DIAGNOSIS — M25572 Pain in left ankle and joints of left foot: Secondary | ICD-10-CM | POA: Diagnosis not present

## 2017-10-06 DIAGNOSIS — M25671 Stiffness of right ankle, not elsewhere classified: Secondary | ICD-10-CM | POA: Diagnosis not present

## 2017-10-14 DIAGNOSIS — M25572 Pain in left ankle and joints of left foot: Secondary | ICD-10-CM | POA: Diagnosis not present

## 2017-10-14 DIAGNOSIS — M25671 Stiffness of right ankle, not elsewhere classified: Secondary | ICD-10-CM | POA: Diagnosis not present

## 2017-10-14 DIAGNOSIS — M25571 Pain in right ankle and joints of right foot: Secondary | ICD-10-CM | POA: Diagnosis not present

## 2017-10-14 DIAGNOSIS — M25672 Stiffness of left ankle, not elsewhere classified: Secondary | ICD-10-CM | POA: Diagnosis not present

## 2017-10-19 DIAGNOSIS — M25571 Pain in right ankle and joints of right foot: Secondary | ICD-10-CM | POA: Diagnosis not present

## 2017-10-19 DIAGNOSIS — M25672 Stiffness of left ankle, not elsewhere classified: Secondary | ICD-10-CM | POA: Diagnosis not present

## 2017-10-19 DIAGNOSIS — M25572 Pain in left ankle and joints of left foot: Secondary | ICD-10-CM | POA: Diagnosis not present

## 2017-10-19 DIAGNOSIS — M25671 Stiffness of right ankle, not elsewhere classified: Secondary | ICD-10-CM | POA: Diagnosis not present

## 2017-10-21 DIAGNOSIS — M25572 Pain in left ankle and joints of left foot: Secondary | ICD-10-CM | POA: Diagnosis not present

## 2017-10-21 DIAGNOSIS — M25672 Stiffness of left ankle, not elsewhere classified: Secondary | ICD-10-CM | POA: Diagnosis not present

## 2017-10-21 DIAGNOSIS — M25571 Pain in right ankle and joints of right foot: Secondary | ICD-10-CM | POA: Diagnosis not present

## 2017-10-21 DIAGNOSIS — M25671 Stiffness of right ankle, not elsewhere classified: Secondary | ICD-10-CM | POA: Diagnosis not present

## 2017-10-23 ENCOUNTER — Encounter: Payer: Self-pay | Admitting: Podiatry

## 2017-10-23 ENCOUNTER — Ambulatory Visit (INDEPENDENT_AMBULATORY_CARE_PROVIDER_SITE_OTHER): Payer: BLUE CROSS/BLUE SHIELD | Admitting: Podiatry

## 2017-10-23 ENCOUNTER — Other Ambulatory Visit: Payer: Self-pay

## 2017-10-23 DIAGNOSIS — M7660 Achilles tendinitis, unspecified leg: Secondary | ICD-10-CM

## 2017-10-23 MED ORDER — TRIAMCINOLONE ACETONIDE 10 MG/ML IJ SUSP
10.0000 mg | Freq: Once | INTRAMUSCULAR | Status: AC
  Administered 2017-10-23: 10 mg

## 2017-10-23 MED ORDER — ESCITALOPRAM OXALATE 10 MG PO TABS: 10.0000 mg | ORAL_TABLET | Freq: Every day | ORAL | 1 refills | Status: DC

## 2017-10-23 NOTE — Progress Notes (Signed)
Subjective:   Patient ID: Jenna Pierce, female   DOB: 51 y.o.   MRN: [id number]   HPI Patient presents stating that her heels were doing better for a little while but they started to hurt again and she is having a lot of pain for the Christmas season and her daughter is a senior in high school and getting ready to go to college and she knows that eventually she will probably need surgery but she like to try to hold off and also they are getting ready to go to [place] on a trip.   ROS      Objective:  Physical Exam  Exquisite discomfort on the medial side of the Achilles tendon bilateral with also pain central and lateral but not to the same degree with extreme thickness around the area with large bone spur formation.     Assessment:  Achilles tendinitis bilateral with tendinitis mostly of the medial central side with inflammation spur formation pain.     Plan:  Spent a great deal of time going over this with her and at this point we are going to continue conservative care even though she understands we cannot continue along this way and there is always risk of rupture associated with injections.  She wants to take this risk and today I carefully injected the medial side bilateral 3 mg dexamethasone Kenalog 5 mg Xylocaine advised on reduced activity and that I do not want her doing anything to stress these tendons.  She will be seen back in 6 weeks and again the a decision on surgery will eventually need to be made given the chronic nature her pain in the amount of problems she is

## 2017-12-02 ENCOUNTER — Other Ambulatory Visit: Payer: Self-pay

## 2017-12-02 MED ORDER — ESCITALOPRAM OXALATE 10 MG PO TABS: 10.0000 mg | ORAL_TABLET | Freq: Every day | ORAL | 0 refills | Status: DC

## 2017-12-04 ENCOUNTER — Ambulatory Visit: Payer: BLUE CROSS/BLUE SHIELD | Admitting: Podiatry

## 2017-12-09 ENCOUNTER — Encounter: Payer: Self-pay | Admitting: Podiatry

## 2017-12-09 ENCOUNTER — Ambulatory Visit (INDEPENDENT_AMBULATORY_CARE_PROVIDER_SITE_OTHER): Payer: BLUE CROSS/BLUE SHIELD | Admitting: Podiatry

## 2017-12-09 DIAGNOSIS — M7660 Achilles tendinitis, unspecified leg: Secondary | ICD-10-CM

## 2017-12-09 NOTE — Progress Notes (Signed)
Subjective:   Patient ID: Jenna Pierce, female   DOB: 52 y.o.   MRN: [id number]   HPI Patient states that the heel and the back has really been bothering me right over left and making it gradually harder to walk.  Patient states that she is having trouble wearing any form of shoe gear   ROS      Objective:  Physical Exam  Neurovascular status intact with large spurs posterior aspect heel right over left with chronic nature to the condition and failure to respond to numerous conservative treatments that have been attempted     Assessment:  Chronic Achilles tendinitis with spur formation with equinus condition right over left     Plan:  Reviewed condition at great length and did discuss and introduced patient to Dr. Carman Ching.  I do think this is going to require posterior Achilles tendon surgery with removal of spurs and reattachment with anchor with probable gastroc recession.  I reviewed this with the patient and patient understands wants surgery and will talk with her husband and schedule and I am going to have Dr. Carman Ching do the procedure and I will assistant

## 2017-12-15 ENCOUNTER — Encounter: Payer: Self-pay | Admitting: Physician Assistant

## 2017-12-25 DIAGNOSIS — F4323 Adjustment disorder with mixed anxiety and depressed mood: Secondary | ICD-10-CM | POA: Diagnosis not present

## 2018-01-08 DIAGNOSIS — F4323 Adjustment disorder with mixed anxiety and depressed mood: Secondary | ICD-10-CM | POA: Diagnosis not present

## 2018-01-15 DIAGNOSIS — F4323 Adjustment disorder with mixed anxiety and depressed mood: Secondary | ICD-10-CM | POA: Diagnosis not present

## 2018-01-21 DIAGNOSIS — F4323 Adjustment disorder with mixed anxiety and depressed mood: Secondary | ICD-10-CM | POA: Diagnosis not present

## 2018-01-29 DIAGNOSIS — F4323 Adjustment disorder with mixed anxiety and depressed mood: Secondary | ICD-10-CM | POA: Diagnosis not present

## 2018-02-02 ENCOUNTER — Encounter: Payer: Self-pay | Admitting: Physician Assistant

## 2018-02-02 ENCOUNTER — Ambulatory Visit (INDEPENDENT_AMBULATORY_CARE_PROVIDER_SITE_OTHER): Payer: BLUE CROSS/BLUE SHIELD | Admitting: Physician Assistant

## 2018-02-02 VITALS — BP 128/90 | HR 87 | Temp 98.6°F | Ht 66.0 in | Wt 256.4 lb

## 2018-02-02 DIAGNOSIS — F418 Other specified anxiety disorders: Secondary | ICD-10-CM

## 2018-02-02 DIAGNOSIS — F341 Dysthymic disorder: Secondary | ICD-10-CM

## 2018-02-02 MED ORDER — ESCITALOPRAM OXALATE 10 MG PO TABS: 10.0000 mg | ORAL_TABLET | Freq: Every day | ORAL | 0 refills | Status: DC

## 2018-02-02 NOTE — Progress Notes (Signed)
Jenna Pierce is a 52 y.o. female is here to follow up on Anxiety.  I acted as a Education administrator for [employer], PA-C Anselmo Pickler, LPN  History of Present Illness:   Chief Complaint  Patient presents with  . Follow-up    Anxiety  Presents for follow-up (Pt was put on Lexapro had 2 months Rx and did not follow up and now has been off medication x 2 months) visit. Symptoms include excessive worry, insomnia, irritability and malaise. Patient reports no chest pain, decreased concentration, muscle tension, nausea, nervous/anxious behavior, palpitations, panic, restlessness, shortness of breath or suicidal ideas. Symptoms occur most days. The severity of symptoms is moderate, causing significant distress and interfering with daily activities. The quality of sleep is good. Nighttime awakenings: usually wakes up once.   Compliance with medications is 51-75%.   Struggling to find a place for her mom to go to. Is now going to counseling, has been x 5-6 week. Daughter is going to go to [employer] this fall. Didn't notice any changes with appetite with Lexapro and is ready to resume it.  Wt Readings from Last 4 Encounters:  02/02/18 256 lb 6.4 oz (116.3 kg)  08/21/17 254 lb 8 oz (115.4 kg)  07/02/16 225 lb (102.1 kg)  09/17/15 233 lb 3.2 oz (105.8 kg)   GAD 7 : Generalized Anxiety Score 02/02/2018 08/21/2017  Nervous, Anxious, on Edge 3 3  Control/stop worrying 3 3  Worry too much - different things 3 3  Trouble relaxing 2 3  Restless 0 1  Easily annoyed or irritable 2 3  Afraid - awful might happen 0 0  Total GAD 7 Score 13 16   Depression screen [facility] 2/9 02/02/2018 08/21/2017  Decreased Interest 2 -  Down, Depressed, Hopeless 3 1  PHQ - 2 Score 5 1  Altered sleeping 3 -  Tired, decreased energy 3 -  Change in appetite 2 -  Feeling bad or failure about yourself  2 -  Trouble concentrating 1 -  Moving slowly or fidgety/restless 1 -  Suicidal thoughts 0 -  PHQ-9 Score 17 -  Difficult  doing work/chores Somewhat difficult -       Health Maintenance Due  Topic Date Due  . HIV Screening  10/29/1983  . MAMMOGRAM  12/10/2017    Past Medical History:  Diagnosis Date  . Ovarian cyst      Social History   Socioeconomic History  . Marital status: Married    Spouse name: Not on file  . Number of children: Not on file  . Years of education: Not on file  . Highest education level: Not on file  Social Needs  . Financial resource strain: Not on file  . Food insecurity - worry: Not on file  . Food insecurity - inability: Not on file  . Transportation needs - medical: Not on file  . Transportation needs - non-medical: Not on file  Occupational History  . Not on file  Tobacco Use  . Smoking status: Never Smoker  . Smokeless tobacco: Never Used  Substance and Sexual Activity  . Alcohol use: Yes    Comment: 3 drinks a week wine or beer  . Drug use: No  . Sexual activity: Yes  Other Topics Concern  . 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 ABDOMINAL HYSTERECTOMY    . BUNIONECTOMY Bilateral 2017   done 4 weeks apart  . distal radius repair Left 2016  . FRACTURE SURGERY    . pin and repair 5th metacarpal  2017  . ROBOTIC ASSISTED TOTAL HYSTERECTOMY WITH BILATERAL SALPINGO OOPHERECTOMY  08/24/2015   Performed by Dr[facility] Nancy Marus at [facility]  . WRIST FRACTURE SURGERY Left 02/26/2015   hardware and screws placed    Family History  Problem Relation Age of Onset  . Diabetes Father   . Arthritis Father   . Hyperlipidemia Father   . Hypertension Father   . Heart attack Father   . Breast cancer Paternal Grandmother   . Arthritis Mother   . COPD Mother   . Depression Mother   . Alzheimer's disease Mother   . Alcohol abuse Maternal Grandfather     PMHx, SurgHx, SocialHx, FamHx, Medications, and  Allergies were reviewed in the Visit Navigator and updated as appropriate.   Patient Active Problem List   Diagnosis Date Noted  . KNEE PAIN, RIGHT 01/23/2011  . BUNION 01/23/2011  . TALIPES CAVUS 01/23/2011    Social History   Tobacco Use  . Smoking status: Never Smoker  . Smokeless tobacco: Never Used  Substance Use Topics  . Alcohol use: Yes    Comment: 3 drinks a week wine or beer  . Drug use: No    Current Medications and Allergies:    Current Outpatient Medications:  .  escitalopram (LEXAPRO) 10 MG tablet, Take 1 tablet (10 mg total) by mouth daily., Disp: 90 tablet, Rfl: 0 .  estradiol (CLIMARA - DOSED IN MG/24 HR) 0.05 mg/24hr patch, APPLY 1 PATCH WEEKLY AS DIRECTED, Disp: , Rfl: 4  No Known Allergies  Review of Systems   Review of Systems  Constitutional: Positive for irritability.  Respiratory: Negative for shortness of breath.   Cardiovascular: Negative for chest pain and palpitations.  Gastrointestinal: Negative for nausea.  Psychiatric/Behavioral: Negative for decreased concentration and suicidal ideas. The patient has insomnia. The patient is not nervous/anxious.     Vitals:   Vitals:   02/02/18 1119  BP: 128/90  Pulse: 87  Temp: 98.6 F (37 C)  TempSrc: Oral  SpO2: 93%  Weight: 256 lb 6.4 oz (116.3 kg)  Height: 5\' 6"  (1.676 m)     Body mass index is 41.38 kg/m.   Physical Exam:    Physical Exam  Constitutional: She appears well-developed. She is cooperative.  Non-toxic appearance. She does not have a sickly appearance. She does not appear ill. No distress.  Cardiovascular: Normal rate, regular rhythm, S1 normal, S2 normal, normal heart sounds and normal pulses.  No LE edema  Pulmonary/Chest: Effort normal and breath sounds normal.  Neurological: She is alert. GCS eye subscore is 4. GCS verbal subscore is 5. GCS motor subscore is 6.  Skin: Skin is warm, dry and intact.  Psychiatric: She has a normal mood and affect. Her speech is normal  and behavior is normal.  Pleasant  Nursing note and vitals reviewed.    Assessment and Plan:    [name] was seen today for follow-up.  Diagnoses and all orders for this visit:  Situational anxiety and Dysthymia Denies SI/HI, I discussed with patient that if they develop any SI, to tell someone immediately and seek medical attention. Resume Lexapro 10 mg daily. Follow-up in 2-4 weeks. I actually recommended that she see Dr. Juleen China to follow-up on this medication so she could talk about possibly  starting a weight loss medication. Patient verbalized understanding and is agreeable to plan.  Other orders -     escitalopram (LEXAPRO) 10 MG tablet; Take 1 tablet (10 mg total) by mouth daily.   . Reviewed expectations re: course of current medical issues. . Discussed self-management of symptoms. . Outlined signs and symptoms indicating need for more acute intervention. . Patient verbalized understanding and all questions were answered. . See orders for this visit as documented in the electronic medical record. . Patient received an After Visit Summary.  CMA or LPN served as scribe during this visit. History, Physical, and Plan performed by medical provider. Documentation and orders reviewed and attested to.  Inda Coke, PA-C [place] 02/02/2018  Follow-up: Return in about 3 weeks (around 02/23/2018) for discuss weight loss medications with Dr. Juleen China.

## 2018-02-02 NOTE — Patient Instructions (Signed)
Please follow-up with Dr. Juleen China in 2-4 weeks to discuss weight loss medications.

## 2018-02-12 DIAGNOSIS — F4323 Adjustment disorder with mixed anxiety and depressed mood: Secondary | ICD-10-CM | POA: Diagnosis not present

## 2018-02-16 ENCOUNTER — Ambulatory Visit: Payer: BLUE CROSS/BLUE SHIELD | Admitting: Physician Assistant

## 2018-02-23 ENCOUNTER — Encounter: Payer: Self-pay | Admitting: Family Medicine

## 2018-02-23 ENCOUNTER — Ambulatory Visit (INDEPENDENT_AMBULATORY_CARE_PROVIDER_SITE_OTHER): Payer: BLUE CROSS/BLUE SHIELD | Admitting: Family Medicine

## 2018-02-23 VITALS — BP 110/82 | HR 69 | Temp 98.3°F | Ht 66.0 in | Wt 258.2 lb

## 2018-02-23 DIAGNOSIS — F418 Other specified anxiety disorders: Secondary | ICD-10-CM

## 2018-02-23 DIAGNOSIS — M7661 Achilles tendinitis, right leg: Secondary | ICD-10-CM | POA: Insufficient documentation

## 2018-02-23 DIAGNOSIS — M7662 Achilles tendinitis, left leg: Secondary | ICD-10-CM | POA: Diagnosis not present

## 2018-02-23 DIAGNOSIS — Z79899 Other long term (current) drug therapy: Secondary | ICD-10-CM | POA: Diagnosis not present

## 2018-02-23 MED ORDER — ESCITALOPRAM OXALATE 10 MG PO TABS: 10.0000 mg | ORAL_TABLET | Freq: Every day | ORAL | 2 refills | Status: DC

## 2018-02-23 MED ORDER — PHENTERMINE HCL 37.5 MG PO TABS: 37.5000 mg | ORAL_TABLET | Freq: Every day | ORAL | 2 refills | Status: DC

## 2018-02-23 NOTE — Progress Notes (Signed)
Jenna Pierce is a 52 y.o. female is here for follow up.  History of Present Illness:  Jenna Pierce CMA acting as scribe for Dr. Juleen China.  HPI: Patient comes in today to discuss weight loss medication. She is wanting to know if she can take the Lexapro and weight loss medication together.   Health Maintenance Due  Topic Date Due  . HIV Screening  10/29/1983  . MAMMOGRAM  12/10/2017   Depression screen PHQ 2/9 02/02/2018 08/21/2017  Decreased Interest 2 -  Down, Depressed, Hopeless 3 1  PHQ - 2 Score 5 1  Altered sleeping 3 -  Tired, decreased energy 3 -  Change in appetite 2 -  Feeling bad or failure about yourself  2 -  Trouble concentrating 1 -  Moving slowly or fidgety/restless 1 -  Suicidal thoughts 0 -  PHQ-9 Score 17 -  Difficult doing work/chores Somewhat difficult -   PMHx, SurgHx, SocialHx, FamHx, Medications, and Allergies were reviewed in the Visit Navigator and updated as appropriate.   Patient Active Problem List   Diagnosis Date Noted  . Situational anxiety 02/23/2018  . Morbid obesity ([place]) 02/23/2018  . Achilles tendinitis of both lower extremities 02/23/2018  . KNEE PAIN, RIGHT 01/23/2011  . BUNION 01/23/2011  . TALIPES CAVUS 01/23/2011   Social History   Tobacco Use  . Smoking status: Never Smoker  . Smokeless tobacco: Never Used  Substance Use Topics  . Alcohol use: Yes    Comment: 3 drinks a week wine or beer  . Drug use: No   Current Medications and Allergies:   .  escitalopram (LEXAPRO) 10 MG tablet, Take 1 tablet (10 mg total) by mouth daily., Disp: 90 tablet, Rfl: 0 .  estradiol (CLIMARA - DOSED IN MG/24 HR) 0.05 mg/24hr patch, APPLY 1 PATCH WEEKLY AS DIRECTED, Disp: , Rfl: 4  No Known Allergies   Review of Systems   Pertinent items are noted in the HPI. Otherwise, ROS is negative.  Vitals:   Vitals:   02/23/18 1117  BP: 110/82  Pulse: 69  Temp: 98.3 F (36.8 C)  TempSrc: Oral  SpO2: 94%  Weight: 258 lb 3.2 oz (117.1  kg)  Height: 5\' 6"  (1.676 m)     Body mass index is 41.67 kg/m.   Physical Exam:   Physical Exam  Constitutional: She is oriented to person, place, and time. She appears well-developed and well-nourished. No distress.  HENT:  Head: Normocephalic and atraumatic.  Right Ear: External ear normal.  Left Ear: External ear normal.  Nose: Nose normal.  Mouth/Throat: Oropharynx is clear and moist.  Eyes: Pupils are equal, round, and reactive to light. Conjunctivae and EOM are normal.  Neck: Normal range of motion. Neck supple. No thyromegaly present.  Cardiovascular: Normal rate, regular rhythm, normal heart sounds and intact distal pulses.  Pulmonary/Chest: Effort normal and breath sounds normal.  Abdominal: Soft. Bowel sounds are normal.  Musculoskeletal: Normal range of motion.  Lymphadenopathy:    She has no cervical adenopathy.  Neurological: She is alert and oriented to person, place, and time.  Skin: Skin is warm and dry. Capillary refill takes less than 2 seconds.  Psychiatric: She has a normal mood and affect. Her behavior is normal.  Nursing note and vitals reviewed.   Assessment and Plan:   Jenna Pierce was seen today for weight loss.  Diagnoses and all orders for this visit:  Medication management -     EKG 12-Lead  Morbid obesity ([place]) Comments:  Contraindications to weight loss: none. Patient readiness to commit to diet and activity changes: excellent. Barriers to weight loss: bilateral heel injury, stress.  Plan: 1. Diagnostic studies to rule out secondary causes of obesity: see below. 2. General patient education:   Average sustained weight loss in long-term studies w/lifestyle interventions alone is 10-15 lb.  Importance of long-term maintenance tx in weight loss.  Use non-food self-rewards to reinforce behavior changes.  Elicit support from others; identify saboteurs.  Practical target weight is usually around 2 BMI units below current weight. 3. Diet  interventions:   Risks of dieting were reviewed, including fatigue, temporary hair loss, gallstone formation, gout, and with very low calorie diets, electrolyte abnormalities, nutrient inadequacies, and loss of lean body mass. 4. Exercise intervention:   Informal measures, e.g. taking stairs instead of elevator.  Formal exercise regimen options. 5. Other behavioral treatment: stress management. 6. Other treatment: Medication: Phentermine.  We also reviewed bariatric surgery as an option. 7. Patient to keep a weight log that we will review at follow up. 8. Follow up: 3 months and as needed.  Orders: -     phentermine (ADIPEX-P) 37.5 MG tablet; Take 1 tablet (37.5 mg total) by mouth daily before breakfast.  Situational anxiety Comments: Symptoms improved on the Lexapro 10 mg p.o. daily.  No side effects.  Continue current medication. Orders: -     escitalopram (LEXAPRO) 10 MG tablet; Take 1 tablet (10 mg total) by mouth daily.  Achilles tendinitis of both lower extremities Comments: Patient is anticipating surgery on the right in the fall.  This inhibits exercise.   . Reviewed expectations re: course of current medical issues. . Discussed self-management of symptoms. . Outlined signs and symptoms indicating need for more acute intervention. . Patient verbalized understanding and all questions were answered. Marland Kitchen Health Maintenance issues including appropriate healthy diet, exercise, and smoking avoidance were discussed with patient. . See orders for this visit as documented in the electronic medical record. . Patient received an After Visit Summary.  Briscoe Deutscher, DO [place], [facility] 02/23/2018  No future appointments.

## 2018-02-26 DIAGNOSIS — F4323 Adjustment disorder with mixed anxiety and depressed mood: Secondary | ICD-10-CM | POA: Diagnosis not present

## 2018-03-19 DIAGNOSIS — F4323 Adjustment disorder with mixed anxiety and depressed mood: Secondary | ICD-10-CM | POA: Diagnosis not present

## 2018-03-29 DIAGNOSIS — F4323 Adjustment disorder with mixed anxiety and depressed mood: Secondary | ICD-10-CM | POA: Diagnosis not present

## 2018-04-16 DIAGNOSIS — F4323 Adjustment disorder with mixed anxiety and depressed mood: Secondary | ICD-10-CM | POA: Diagnosis not present

## 2018-04-23 DIAGNOSIS — F4323 Adjustment disorder with mixed anxiety and depressed mood: Secondary | ICD-10-CM | POA: Diagnosis not present

## 2018-05-07 DIAGNOSIS — F4323 Adjustment disorder with mixed anxiety and depressed mood: Secondary | ICD-10-CM | POA: Diagnosis not present

## 2018-06-02 DIAGNOSIS — F4323 Adjustment disorder with mixed anxiety and depressed mood: Secondary | ICD-10-CM | POA: Diagnosis not present

## 2018-06-10 DIAGNOSIS — F4323 Adjustment disorder with mixed anxiety and depressed mood: Secondary | ICD-10-CM | POA: Diagnosis not present

## 2018-06-23 DIAGNOSIS — F4323 Adjustment disorder with mixed anxiety and depressed mood: Secondary | ICD-10-CM | POA: Diagnosis not present

## 2018-07-06 DIAGNOSIS — F4323 Adjustment disorder with mixed anxiety and depressed mood: Secondary | ICD-10-CM | POA: Diagnosis not present

## 2018-08-31 ENCOUNTER — Telehealth: Payer: Self-pay | Admitting: *Deleted

## 2018-08-31 NOTE — Telephone Encounter (Signed)
Sounds good

## 2018-08-31 NOTE — Telephone Encounter (Signed)
"  I called and left you a message yesterday.  I need to schedule surgery with Dr. Jacqualyn Posey."  Have you signed your consent forms to have surgery?  "I don't think I have signed anything.  I was a patient of Dr. Paulla Dolly.  Dr. Paulla Dolly referred me to Dr. Jacqualyn Posey.  I was supposed to have had surgery sooner but now I'm just getting around to doing it."  You need to schedule a consultation with Dr. Jacqualyn Posey so you can sign consent forms.  "I have already done that, I'm scheduled for October 28."  I can schedule you tentatively until you have your consult with Dr. Jacqualyn Posey.  Dr. Jacqualyn Posey can do it on November 6.  "That date will be fine.  Thank you so much."

## 2018-09-13 ENCOUNTER — Encounter: Payer: Self-pay | Admitting: Podiatry

## 2018-09-13 ENCOUNTER — Ambulatory Visit (INDEPENDENT_AMBULATORY_CARE_PROVIDER_SITE_OTHER): Payer: BLUE CROSS/BLUE SHIELD | Admitting: Podiatry

## 2018-09-13 ENCOUNTER — Ambulatory Visit (INDEPENDENT_AMBULATORY_CARE_PROVIDER_SITE_OTHER): Payer: BLUE CROSS/BLUE SHIELD

## 2018-09-13 DIAGNOSIS — M7662 Achilles tendinitis, left leg: Secondary | ICD-10-CM | POA: Diagnosis not present

## 2018-09-13 DIAGNOSIS — M7661 Achilles tendinitis, right leg: Secondary | ICD-10-CM | POA: Diagnosis not present

## 2018-09-13 DIAGNOSIS — M7730 Calcaneal spur, unspecified foot: Secondary | ICD-10-CM

## 2018-09-13 DIAGNOSIS — M7660 Achilles tendinitis, unspecified leg: Secondary | ICD-10-CM

## 2018-09-21 NOTE — Progress Notes (Signed)
Subjective: 52 year old female presents the office today for concerns have chronic pain to the back to both of her heels on the right heel spur as well as Achilles tendinitis.  She is been under the care of Dr. Paulla Dolly for quite some time she is attempted numerous conservative treatments including stretching, icing, anti-inflammatories, shoe modifications, orthotics, EPAT without any significant improvement.  She presents today for surgical consultation to remove the bone spur as well as for Achilles tendon debridement.  She is scheduled to have this time the next 2 weeks.  However after talking to her she has a trip planned to [place] about a month after surgery.  She denies any acute changes since she was last seen by Dr. Paulla Dolly.  Since she was last seen she has not been doing any stretching, icing exercises she has not been doing any treatment because is not been helpful previously.  She states that she has stopped it has become more painful. Denies any systemic complaints such as fevers, chills, nausea, vomiting. No acute changes since last appointment, and no other complaints at this time.   Objective: AAO x3, NAD DP/PT pulses palpable bilaterally, CRT less than 3 seconds Chronic heel spurring present in the posterior aspects of bilateral heels and is tenderness palpation of the posterior aspect of the heels.  Mild tenderness on the distal portion of the Achilles tendon along the insertion of the calcaneus.  Equinus is present.  There is no pain with lateral compression of the calcaneus.  Achilles tendon appears to be intact bilaterally there is no other areas of tenderness identified today. No open lesions or pre-ulcerative lesions.  No pain with calf compression, swelling, warmth, erythema  Assessment: Prominent heel spurs present bilaterally with Achilles tendinitis, equinus  Plan: -All treatment options discussed with the patient including all alternatives, risks, complications.  -Repeat  x-rays were obtained reviewed which reveals posterior calcaneal spurring present.  No evidence of acute fracture. -We discussed both conservative as well as surgical care.  Conservatively I want her to continue with home stretching, icing exercises daily I dispensed a night splint for her to wear as well.  Heel lift as well. -She still wants to proceed with surgery but after discussion she has a trip planned to [place] and I do not think she is doing very mobile if she goes ahead of surgery now.  Because this regularly surgery until after her vacation.  We will plan on doing this at the end of the summer.  We will see her back prior to this for surgical consent we discussed the surgery as well as postoperative course were discussed alternatives, risks, complications of the procedure.  Risks include but not limited to, rupture of the tendon, infection, need for further surgery, recurrence, DVT, left leg. -Patient encouraged to call the office with any questions, concerns, change in symptoms.    Trula Slade DPM

## 2018-09-23 ENCOUNTER — Telehealth: Payer: Self-pay | Admitting: *Deleted

## 2018-09-23 NOTE — Telephone Encounter (Signed)
"  I'm calling to let you know we have you scheduled for December 30.  The surgery scheduler at the surgical center said right now you are the only one scheduled for that date.  If surgeries are not added on we will have to cancel it.  However, she said that we still have plenty of time left for others to schedule appointments.  She said she will keep an eye on the schedule and will let me know if we need to reschedule you.  "Well hopefully others will schedule.  I really want to have this done before the year ends because I have met my deductible.  Thanks for calling me back and letting me know."

## 2018-09-23 NOTE — Telephone Encounter (Signed)
"  I'm calling to schedule my surgery with Dr. Jacqualyn Posey.  It was scheduled for November 6.  Something came up and I couldn't do it then.  I had mentioned to Dr. Jacqualyn Posey about doing it at the end of the year.  No one has called me back.  "Dr. Jacqualyn Posey was trying to coordinate a date to do it with Dr. Paulla Dolly.  They couldn't come up with a mutual date that worked for them both.  He asked me to let you know and see if you were okay with just him doing it.  "Dr. Paulla Dolly did both my bunions but I'm okay with Dr. Jacqualyn Posey doing the surgery if he's okay with doing it by himself."  Yes, he said he doesn't need anyone to assist him.  Dr. Jacqualyn Posey said he can do it either December 30 or the 31.  I have to call and see if those dates are available.  "I'm okay with both dates.  Will you call and let me know?"

## 2018-09-24 NOTE — Telephone Encounter (Signed)
Does she mean I need to schedule other surgeries that day or other doctors? I can do other cases that day and block the morning to do cases. Thanks.

## 2018-09-24 NOTE — Telephone Encounter (Signed)
I will schedule Ms [place] surgery.  I called and spoke to [place] at [facility]. I requested block time from 7 am to 12 pm at the surgical center on 11/15/18 per Dr. Jacqualyn Posey.  The time was granted.

## 2018-11-01 ENCOUNTER — Encounter: Payer: Self-pay | Admitting: Podiatry

## 2018-11-01 ENCOUNTER — Ambulatory Visit (INDEPENDENT_AMBULATORY_CARE_PROVIDER_SITE_OTHER): Payer: BLUE CROSS/BLUE SHIELD | Admitting: Podiatry

## 2018-11-01 DIAGNOSIS — M7660 Achilles tendinitis, unspecified leg: Secondary | ICD-10-CM

## 2018-11-01 DIAGNOSIS — Z01818 Encounter for other preprocedural examination: Secondary | ICD-10-CM | POA: Diagnosis not present

## 2018-11-01 DIAGNOSIS — M216X2 Other acquired deformities of left foot: Secondary | ICD-10-CM

## 2018-11-01 DIAGNOSIS — M216X1 Other acquired deformities of right foot: Secondary | ICD-10-CM | POA: Diagnosis not present

## 2018-11-01 DIAGNOSIS — M7730 Calcaneal spur, unspecified foot: Secondary | ICD-10-CM | POA: Diagnosis not present

## 2018-11-01 NOTE — Patient Instructions (Signed)

## 2018-11-03 ENCOUNTER — Other Ambulatory Visit: Payer: Self-pay | Admitting: *Deleted

## 2018-11-03 DIAGNOSIS — M7661 Achilles tendinitis, right leg: Secondary | ICD-10-CM

## 2018-11-03 DIAGNOSIS — M7731 Calcaneal spur, right foot: Secondary | ICD-10-CM

## 2018-11-03 NOTE — Progress Notes (Signed)
Per Dr. Jacqualyn Posey, I placed an order for a knee scooter for the patient to use post- operatively on 11/15/2018.  I contacted [place] to assist the patient in getting the scooter.

## 2018-11-03 NOTE — Progress Notes (Signed)
Subjective: 52 year old female presents the office today for surgical consultation due to a painful heel spurs as well as a pulling sensation in the back of her heel.  This is been ongoing issue for quite some time and despite numerous conservative treatments she wished to proceed with surgical intervention.  She states the right side is more painful than the left and she was pursued the right side first.  She has no new concerns otherwise since I last saw her. Denies any systemic complaints such as fevers, chills, nausea, vomiting. No acute changes since last appointment, and no other complaints at this time.   Objective: AAO x3, NAD DP/PT pulses palpable bilaterally, CRT less than 3 seconds Significant prominence of the posterior heel consistent with a heel spur present.  Discomfort on the distal portion Achilles tendon on the insertion into the calcaneus.  Equinus is also present.  There is no pain on lateral compression of the calcaneus.  Achilles tendon appears to be intact and Thompson test is negative.  The right side is more painful than left.  No other areas of tenderness. No open lesions or pre-ulcerative lesions.  No pain with calf compression, swelling, warmth, erythema  Assessment: Bilateral posterior heel spurs with insertional Achilles tendinitis, gastrocnemius equinus.; right >> left  Plan: -All treatment options discussed with the patient including all alternatives, risks, complications.  -I reviewed the x-rays with her.  Her husband is also present today's appointment.  We discussed the surgery as well as postoperative course.  They want to proceed. -We will plan for right foot posterior heel spur resection, Achilles tendon debridement with gastroc recession. -The incision placement as well as the postoperative course was discussed with the patient. I discussed risks of the surgery which include, but not limited to, infection, bleeding, pain, swelling, need for further surgery,  delayed or nonhealing, painful or ugly scar, numbness or sensation changes, over/under correction, recurrence, transfer lesions, further deformity, hardware failure, DVT/PE, loss of toe/foot. Patient understands these risks and wishes to proceed with surgery. The surgical consent was reviewed with the patient all 3 pages were signed. No promises or guarantees were given to the outcome of the procedure. All questions were answered to the best of my ability. Before the surgery the patient was encouraged to call the office if there is any further questions. The surgery will be performed at the [facility] on an outpatient basis. -We will order knee scooter preoperatively. -We will also order physical therapy to be done preoperatively to teach her method to remain nonweightbearing at home and steps. -Patient encouraged to call the office with any questions, concerns, change in symptoms.   Trula Slade DPM

## 2018-11-04 NOTE — Addendum Note (Signed)
Addended by: [place] Mon R on: 11/04/2018 08:10 AM   Modules accepted: Orders

## 2018-11-11 ENCOUNTER — Ambulatory Visit: Payer: BLUE CROSS/BLUE SHIELD | Admitting: Podiatry

## 2018-11-15 ENCOUNTER — Other Ambulatory Visit: Payer: Self-pay | Admitting: Podiatry

## 2018-11-15 ENCOUNTER — Encounter: Payer: Self-pay | Admitting: Podiatry

## 2018-11-15 DIAGNOSIS — Z01818 Encounter for other preprocedural examination: Secondary | ICD-10-CM | POA: Diagnosis not present

## 2018-11-15 DIAGNOSIS — M216X1 Other acquired deformities of right foot: Secondary | ICD-10-CM | POA: Diagnosis not present

## 2018-11-15 DIAGNOSIS — M7661 Achilles tendinitis, right leg: Secondary | ICD-10-CM | POA: Diagnosis not present

## 2018-11-15 DIAGNOSIS — M25571 Pain in right ankle and joints of right foot: Secondary | ICD-10-CM | POA: Diagnosis not present

## 2018-11-15 DIAGNOSIS — M7660 Achilles tendinitis, unspecified leg: Secondary | ICD-10-CM | POA: Diagnosis not present

## 2018-11-15 DIAGNOSIS — M7731 Calcaneal spur, right foot: Secondary | ICD-10-CM | POA: Diagnosis not present

## 2018-11-15 DIAGNOSIS — M62461 Contracture of muscle, right lower leg: Secondary | ICD-10-CM | POA: Diagnosis not present

## 2018-11-15 MED ORDER — OXYCODONE-ACETAMINOPHEN 5-325 MG PO TABS: 1.0000 | ORAL_TABLET | Freq: Four times a day (QID) | ORAL | 0 refills | Status: AC | PRN

## 2018-11-15 MED ORDER — OXYCODONE-ACETAMINOPHEN 5-325 MG PO TABS: 1.0000 | ORAL_TABLET | Freq: Four times a day (QID) | ORAL | 0 refills | Status: DC | PRN

## 2018-11-15 MED ORDER — IBUPROFEN 800 MG PO TABS: 800.0000 mg | ORAL_TABLET | Freq: Three times a day (TID) | ORAL | 0 refills | Status: DC | PRN

## 2018-11-15 MED ORDER — CEPHALEXIN 500 MG PO CAPS: 500.0000 mg | ORAL_CAPSULE | Freq: Three times a day (TID) | ORAL | 0 refills | Status: DC

## 2018-11-15 MED ORDER — PROMETHAZINE HCL 25 MG PO TABS: 25.0000 mg | ORAL_TABLET | Freq: Three times a day (TID) | ORAL | 0 refills | Status: DC | PRN

## 2018-11-15 NOTE — Progress Notes (Signed)
Pre-operative Note  Patient presents to the [facility] today for surgical intervention of the right foot for posterior heel spur resection, achilles tendon debridement, gastroc recession. The surgical consent was reviewed with the patient and we discussed the procedure as well as the postoperative course. I again discussed all alternatives, risks, complications. I answered all of their questions to the best of my ability and they wish to proceed with surgery. No promises or guarantees were given as to the outcome of the surgery.   The surgical consent was signed.   Patient is NPO since midnight.  The patient does not have have a history of blood clots or bleeding disorders.   Postop medications sent to the pharmacy.   Her husband is at bedside.  No further questions.   Jenna Pierce, [place]

## 2018-11-18 ENCOUNTER — Telehealth: Payer: Self-pay | Admitting: *Deleted

## 2018-11-18 NOTE — Telephone Encounter (Signed)
Called and left a message for the patient to call me back and I stated that I was calling to check on patient after surgery with Dr Jacqualyn Posey on 11-15-18 and the phone number is [phone]. [name]

## 2018-11-23 ENCOUNTER — Ambulatory Visit (INDEPENDENT_AMBULATORY_CARE_PROVIDER_SITE_OTHER): Payer: BLUE CROSS/BLUE SHIELD

## 2018-11-23 ENCOUNTER — Ambulatory Visit (INDEPENDENT_AMBULATORY_CARE_PROVIDER_SITE_OTHER): Payer: BLUE CROSS/BLUE SHIELD | Admitting: Podiatry

## 2018-11-23 VITALS — Temp 97.9°F

## 2018-11-23 DIAGNOSIS — Z09 Encounter for follow-up examination after completed treatment for conditions other than malignant neoplasm: Secondary | ICD-10-CM

## 2018-11-23 DIAGNOSIS — M7731 Calcaneal spur, right foot: Secondary | ICD-10-CM

## 2018-11-23 DIAGNOSIS — M7661 Achilles tendinitis, right leg: Secondary | ICD-10-CM

## 2018-11-29 NOTE — Progress Notes (Signed)
Subjective: Anselma Herbel is a 53 y.o. is seen today in office s/p right foot gastrocnemius recession, posterior heel spur resection as well as Achilles tenolysis preformed on 11/15/2018.  Overall she states that she is doing well and her pain is controlled.  She is been try to be nonweightbearing much as possible and see operative foot and keep it elevated. Denies any systemic complaints such as fevers, chills, nausea, vomiting. No calf pain, chest pain, shortness of breath.   Objective: General: No acute distress, AAOx3  DP/PT pulses palpable 2/4, CRT < 3 sec to all digits.  Protective sensation intact. Motor function intact.  RIGHT foot: Cast is clean, dry, intact.  Incision is well coapted without any evidence of dehiscence and sutures, staples are intact. There is no surrounding erythema, ascending cellulitis, fluctuance, crepitus, malodor, drainage/purulence. There is mild edema around the surgical site. There is minimal pain along the surgical site.  There is some ecchymosis to the posterior heel.  Overall the incision appears to be healing well with any signs of dehiscence or infection. No other areas of tenderness to bilateral lower extremities.  No other open lesions or pre-ulcerative lesions.  No pain with calf compression, swelling, warmth, erythema.   Assessment and Plan:  Status post right foot surgery, doing well with no complications   -Treatment options discussed including all alternatives, risks, and complications -X-rays were obtained reviewed.  Status post posterior heel resection.  No evidence of acute fracture. -Cast was removed.  Incision appears to be doing well.  Antibiotic ointment and a bandage was applied.  A well-padded below-knee fiberglass cast was applied making sure to pad all bony prominences. -Continue nonweightbearing. -Ice/elevation -Pain medication as needed. -Monitor for any clinical signs or symptoms of infection and DVT/PE and directed to call the  office immediately should any occur or go to the ER. -Follow-up as scheduled for possible suture removal or sooner if any problems arise. In the meantime, encouraged to call the office with any questions, concerns, change in symptoms.   Celesta Gentile, DPM

## 2018-12-03 ENCOUNTER — Ambulatory Visit (INDEPENDENT_AMBULATORY_CARE_PROVIDER_SITE_OTHER): Payer: BLUE CROSS/BLUE SHIELD | Admitting: Podiatry

## 2018-12-03 DIAGNOSIS — M7661 Achilles tendinitis, right leg: Secondary | ICD-10-CM | POA: Diagnosis not present

## 2018-12-03 DIAGNOSIS — Z09 Encounter for follow-up examination after completed treatment for conditions other than malignant neoplasm: Secondary | ICD-10-CM

## 2018-12-03 DIAGNOSIS — M7731 Calcaneal spur, right foot: Secondary | ICD-10-CM

## 2018-12-07 NOTE — Progress Notes (Signed)
Subjective: Jenna Pierce is a 53 y.o. is seen today in office s/p right foot gastrocnemius recession, posterior heel spur resection as well as Achilles tenolysis preformed on 11/15/2018.  She presents today for possible suture removal.  Overall she states that she is doing well and her pain is controlled.  The cast has been fitting well without any issues.  She has been nonweightbearing to keep foot elevated as much as possible.  Denies any recent injury or falls. Denies any systemic complaints such as fevers, chills, nausea, vomiting. No calf pain, chest pain, shortness of breath.   Objective: General: No acute distress, AAOx3  DP/PT pulses palpable 2/4, CRT < 3 sec to all digits.  Protective sensation intact. Motor function intact.  RIGHT foot: Cast is clean, dry, intact.  Incision is well coapted without any evidence of dehiscence and sutures, staples are intact. There is no surrounding erythema, ascending cellulitis, fluctuance, crepitus, malodor, drainage/purulence. There is mild edema around the surgical site. There is no significant pain along the surgical site except for directly along with a heel spur was resected there is some mild discomfort of this area but overall is improving.  There is decreased ecchymosis to the posterior heel.  Overall the incision appears to be healing well with any signs of dehiscence or infection. No other areas of tenderness to bilateral lower extremities.  No other open lesions or pre-ulcerative lesions.  No pain with calf compression, swelling, warmth, erythema.   Assessment and Plan:  Status post right foot surgery, doing well with no complications   -Treatment options discussed including all alternatives, risks, and complications -Cast was removed today.  Remove the sutures by let the staples intact.  Antibiotic ointment and a bandage was applied after I cleaned her leg.  A well-padded below-knee fiberglass cast was applied and the Concerta pad all bony  prominences. -Continue nonweightbearing. -Ice/elevation -Pain medication as needed. -Monitor for any clinical signs or symptoms of infection and DVT/PE and directed to call the office immediately should any occur or go to the ER. -Follow-up as scheduled for possible suture removal or sooner if any problems arise. In the meantime, encouraged to call the office with any questions, concerns, change in symptoms.   *Cast removal next appointment possible staple removal.  The pain on the incision will either recast or go into a CAM boot.   Celesta Gentile, DPM

## 2018-12-09 DIAGNOSIS — F4323 Adjustment disorder with mixed anxiety and depressed mood: Secondary | ICD-10-CM | POA: Diagnosis not present

## 2018-12-10 ENCOUNTER — Ambulatory Visit (INDEPENDENT_AMBULATORY_CARE_PROVIDER_SITE_OTHER): Payer: BLUE CROSS/BLUE SHIELD | Admitting: Podiatry

## 2018-12-10 DIAGNOSIS — Z09 Encounter for follow-up examination after completed treatment for conditions other than malignant neoplasm: Secondary | ICD-10-CM

## 2018-12-10 DIAGNOSIS — M7661 Achilles tendinitis, right leg: Secondary | ICD-10-CM

## 2018-12-10 DIAGNOSIS — M7731 Calcaneal spur, right foot: Secondary | ICD-10-CM

## 2018-12-10 NOTE — Progress Notes (Signed)
Subjective: Jenna Pierce is a 53 y.o. is seen today in office s/p right foot gastrocnemius recession, posterior heel spur resection as well as Achilles tenolysis preformed on 11/15/2018. She presents today for cast change and removal of staples. She states that she is doing well and her pain has improved. She is hesitant to go into a boot today.  She does state that she will put her foot down on the ground at times for transfers for short amount of time.  She thinks the cast with her more protection.Denies any systemic complaints such as fevers, chills, nausea, vomiting. No calf pain, chest pain, shortness of breath.   Objective: General: No acute distress, AAOx3  DP/PT pulses palpable 2/4, CRT < 3 sec to all digits.  Protective sensation intact. Motor function intact.  RIGHT foot: Cast is clean, dry, intact.  Incision appears to be healing well and staples are intact.  I removed the staples today and the incision remained well coapted without any evidence of dehiscence.  There is decreased edema to the surgical site.  There is no surrounding erythema, ascending cellulitis.  There is no fluctuation or crepitation malodor.  No clinical signs of infection are noted today. No other open lesions or pre-ulcerative lesions.  No pain with calf compression, swelling, warmth, erythema.   Assessment and Plan:  Status post right foot surgery, doing well with no complications   -Treatment options discussed including all alternatives, risks, and complications -Cast was removed today.  Staples removed any complications.  Antibiotic ointment and a bandage was applied.  A new well-padded below-knee fiberglass cast was applied making sure to pad all bony prominences. -Continue nonweightbearing. -Ice/elevation- she has not been icing but I want her to do this to help with prevent pain/swelling.  -Pain medication as needed. -Monitor for any clinical signs or symptoms of infection and DVT/PE and directed to call  the office immediately should any occur or go to the ER. -Follow-up as scheduled for possible suture removal or sooner if any problems arise. In the meantime, encouraged to call the office with any questions, concerns, change in symptoms.   *Cast removal next appointment.  Next appointment will go into a cam boot and start some gentle range of motion exercises but remain nonweightbearing  Celesta Gentile, DPM

## 2018-12-16 DIAGNOSIS — M7660 Achilles tendinitis, unspecified leg: Secondary | ICD-10-CM | POA: Diagnosis not present

## 2018-12-17 ENCOUNTER — Other Ambulatory Visit: Payer: BLUE CROSS/BLUE SHIELD

## 2018-12-30 DIAGNOSIS — F4323 Adjustment disorder with mixed anxiety and depressed mood: Secondary | ICD-10-CM | POA: Diagnosis not present

## 2018-12-31 ENCOUNTER — Ambulatory Visit (INDEPENDENT_AMBULATORY_CARE_PROVIDER_SITE_OTHER): Payer: BLUE CROSS/BLUE SHIELD | Admitting: Podiatry

## 2018-12-31 ENCOUNTER — Encounter: Payer: Self-pay | Admitting: Podiatry

## 2018-12-31 DIAGNOSIS — M7661 Achilles tendinitis, right leg: Secondary | ICD-10-CM

## 2018-12-31 DIAGNOSIS — M7731 Calcaneal spur, right foot: Secondary | ICD-10-CM

## 2018-12-31 DIAGNOSIS — Z09 Encounter for follow-up examination after completed treatment for conditions other than malignant neoplasm: Secondary | ICD-10-CM

## 2019-01-02 NOTE — Progress Notes (Signed)
Jenna Pierce is a 53 y.o. female is here for follow up.  History of Present Illness:   HPI: See Assessment and Plan section for Problem Based Charting of issues discussed today.  Health Maintenance Due  Topic Date Due  . MAMMOGRAM  12/10/2017  . COLONOSCOPY  10/28/2018   Depression screen [facility] 2/9 01/03/2019 02/02/2018 08/21/2017  Decreased Interest 2 2 -  Down, Depressed, Hopeless 3 3 1   PHQ - 2 Score 5 5 1   Altered sleeping 3 3 -  Tired, decreased energy 3 3 -  Change in appetite 2 2 -  Feeling bad or failure about yourself  3 2 -  Trouble concentrating 3 1 -  Moving slowly or fidgety/restless 0 1 -  Suicidal thoughts 0 0 -  PHQ-9 Score 19 17 -  Difficult doing work/chores Very difficult Somewhat difficult -   PMHx, SurgHx, SocialHx, FamHx, Medications, and Allergies were reviewed in the Visit Navigator and updated as appropriate.   Patient Active Problem List   Diagnosis Date Noted  . Subclinical hypothyroidism 01/13/2019  . Sleep disorder breathing 01/13/2019  . B12 deficiency 01/13/2019  . Situational anxiety 02/23/2018  . Morbid obesity ([place]) 02/23/2018  . Achilles tendinitis of both lower extremities 02/23/2018   Social History   Tobacco Use  . Smoking status: Never Smoker  . Smokeless tobacco: Never Used  Substance Use Topics  . Alcohol use: Yes    Comment: 3 drinks a week wine or beer  . Drug use: No   Current Medications and Allergies   Current Outpatient Medications:  .  escitalopram (LEXAPRO) 10 MG tablet, Take 1 tablet (10 mg total) by mouth daily., Disp: 90 tablet, Rfl: 2  No Known Allergies Review of Systems   Pertinent items are noted in the HPI. Otherwise, a complete ROS is negative.  Vitals   Vitals:   01/03/19 1303  BP: 126/68  Pulse: 85  Temp: 98.6 F (37 C)  TempSrc: Oral  SpO2: 95%  Weight: 264 lb 9.6 oz (120 kg)  Height: 5\' 7"  (1.702 m)     Body mass index is 41.44 kg/m.  Physical Exam   Physical Exam Vitals signs  and nursing note reviewed.  HENT:     Head: Normocephalic and atraumatic.  Eyes:     Pupils: Pupils are equal, round, and reactive to light.  Neck:     Musculoskeletal: Normal range of motion and neck supple.  Cardiovascular:     Rate and Rhythm: Normal rate and regular rhythm.     Heart sounds: Normal heart sounds.  Pulmonary:     Effort: Pulmonary effort is normal.  Abdominal:     Palpations: Abdomen is soft.  Musculoskeletal:     Comments: Right boot.   Skin:    General: Skin is warm.  Psychiatric:        Behavior: Behavior normal.    Results for orders placed or performed in visit on 01/03/19  CBC with Differential/Platelet  Result Value Ref Range   WBC 6.0 4.0 - 10.5 K/uL   RBC 4.91 3.87 - 5.11 Mil/uL   Hemoglobin 14.9 12.0 - 15.0 g/dL   HCT 45.1 36.0 - 46.0 %   MCV 91.8 78.0 - 100.0 fl   MCHC 33.0 30.0 - 36.0 g/dL   RDW 13.5 11.5 - 15.5 %   Platelets 256.0 150.0 - 400.0 K/uL   Neutrophils Relative % 56.3 43.0 - 77.0 %   Lymphocytes Relative 33.3 12.0 - 46.0 %  Monocytes Relative 6.7 3.0 - 12.0 %   Eosinophils Relative 2.9 0.0 - 5.0 %   Basophils Relative 0.8 0.0 - 3.0 %   Neutro Abs 3.4 1.4 - 7.7 K/uL   Lymphs Abs 2.0 0.7 - 4.0 K/uL   Monocytes Absolute 0.4 0.1 - 1.0 K/uL   Eosinophils Absolute 0.2 0.0 - 0.7 K/uL   Basophils Absolute 0.0 0.0 - 0.1 K/uL  Comprehensive metabolic panel  Result Value Ref Range   Sodium 145 135 - 145 mEq/L   Potassium 4.7 3.5 - 5.1 mEq/L   Chloride 105 96 - 112 mEq/L   CO2 30 19 - 32 mEq/L   Glucose, Bld 80 70 - 99 mg/dL   BUN 15 6 - 23 mg/dL   Creatinine, Ser 0.86 0.40 - 1.20 mg/dL   Total Bilirubin 0.5 0.2 - 1.2 mg/dL   Alkaline Phosphatase 128 (H) 39 - 117 U/L   AST 19 0 - 37 U/L   ALT 23 0 - 35 U/L   Total Protein 7.1 6.0 - 8.3 g/dL   Albumin 4.6 3.5 - 5.2 g/dL   Calcium 11.0 (H) 8.4 - 10.5 mg/dL   GFR 69.79 >60.00 mL/min  Hemoglobin A1c  Result Value Ref Range   Hgb A1c MFr Bld 5.3 4.6 - 6.5 %  Lipid panel    Result Value Ref Range   Cholesterol 183 0 - 200 mg/dL   Triglycerides 153.0 (H) 0.0 - 149.0 mg/dL   HDL 57.10 >39.00 mg/dL   VLDL 30.6 0.0 - 40.0 mg/dL   LDL Cholesterol 96 0 - 99 mg/dL   Total CHOL/HDL Ratio 3    NonHDL 126.15   TSH  Result Value Ref Range   TSH 5.14 (H) 0.35 - 4.50 uIU/mL  T4, free  Result Value Ref Range   Free T4 0.87 0.60 - 1.60 ng/dL  VITAMIN D 25 Hydroxy (Vit-D Deficiency, Fractures)  Result Value Ref Range   VITD 28.45 (L) 30.00 - 100.00 ng/mL  Vitamin B12  Result Value Ref Range   Vitamin B-12 184 (L) 211 - 911 pg/mL  HIV Antibody (routine testing w rflx)  Result Value Ref Range   HIV 1&2 Ab, 4th Generation NON-REACTIVE NON-REACTI   Assessment and Plan   Morbid obesity ([place]) Long discussion today regarding weight loss options.  We discussed medications, diet, exercise, and surgical intervention options.  She is unable to exercise at this point due to her postop boot.  Subclinical hypothyroidism Lab Results  Component Value Date   TSH 5.14 (H) 01/03/2019   FREET4 0.87 01/03/2019   Lab Results  Component Value Date   TSH 5.14 (H) 01/03/2019   TSH 8.34 (H) 08/25/2017   FREET4 0.87 01/03/2019   FREET4 0.87 08/25/2017      B12 deficiency Lab Results  Component Value Date   VITAMINB12 184 (L) 01/03/2019   Will offer B12 supplementation.   Achilles tendinitis of both lower extremities With recent surgical intervention and will continue in the boot for a few more weeks. Inhibits exercise.   Sleep disorder breathing Will order sleep study.  Orders Placed This Encounter  Procedures  . CBC with Differential/Platelet  . Comprehensive metabolic panel  . Hemoglobin A1c  . Lipid panel  . TSH  . T4, free  . VITAMIN D 25 Hydroxy (Vit-D Deficiency, Fractures)  . Vitamin B12  . HIV Antibody (routine testing w rflx)  . Ambulatory referral to Pulmonology   . Reviewed expectations re: course of current medical issues. Marland Kitchen  Discussed  self-management of symptoms. . Outlined signs and symptoms indicating need for more acute intervention. . Patient verbalized understanding and all questions were answered. Marland Kitchen Health Maintenance issues including appropriate healthy diet, exercise, and smoking avoidance were discussed with patient. . See orders for this visit as documented in the electronic medical record. . Patient received an After Visit Summary.  Briscoe Deutscher, DO [place] 01/13/2019

## 2019-01-03 ENCOUNTER — Encounter: Payer: Self-pay | Admitting: Family Medicine

## 2019-01-03 ENCOUNTER — Ambulatory Visit (INDEPENDENT_AMBULATORY_CARE_PROVIDER_SITE_OTHER): Payer: BLUE CROSS/BLUE SHIELD | Admitting: Family Medicine

## 2019-01-03 VITALS — BP 126/68 | HR 85 | Temp 98.6°F | Ht 67.0 in | Wt 264.6 lb

## 2019-01-03 DIAGNOSIS — M7662 Achilles tendinitis, left leg: Secondary | ICD-10-CM

## 2019-01-03 DIAGNOSIS — R5381 Other malaise: Secondary | ICD-10-CM | POA: Diagnosis not present

## 2019-01-03 DIAGNOSIS — E559 Vitamin D deficiency, unspecified: Secondary | ICD-10-CM | POA: Diagnosis not present

## 2019-01-03 DIAGNOSIS — Z13220 Encounter for screening for lipoid disorders: Secondary | ICD-10-CM

## 2019-01-03 DIAGNOSIS — Z114 Encounter for screening for human immunodeficiency virus [HIV]: Secondary | ICD-10-CM

## 2019-01-03 DIAGNOSIS — R5383 Other fatigue: Secondary | ICD-10-CM | POA: Diagnosis not present

## 2019-01-03 DIAGNOSIS — E538 Deficiency of other specified B group vitamins: Secondary | ICD-10-CM

## 2019-01-03 DIAGNOSIS — R7989 Other specified abnormal findings of blood chemistry: Secondary | ICD-10-CM

## 2019-01-03 DIAGNOSIS — Z79899 Other long term (current) drug therapy: Secondary | ICD-10-CM | POA: Diagnosis not present

## 2019-01-03 DIAGNOSIS — G4730 Sleep apnea, unspecified: Secondary | ICD-10-CM

## 2019-01-03 DIAGNOSIS — E039 Hypothyroidism, unspecified: Secondary | ICD-10-CM

## 2019-01-03 DIAGNOSIS — M7661 Achilles tendinitis, right leg: Secondary | ICD-10-CM

## 2019-01-03 DIAGNOSIS — E038 Other specified hypothyroidism: Secondary | ICD-10-CM

## 2019-01-03 DIAGNOSIS — Z6841 Body Mass Index (BMI) 40.0 and over, adult: Secondary | ICD-10-CM

## 2019-01-03 LAB — CBC WITH DIFFERENTIAL/PLATELET
Basophils Absolute: 0 10*3/uL (ref 0.0–0.1)
Basophils Relative: 0.8 % (ref 0.0–3.0)
Eosinophils Absolute: 0.2 10*3/uL (ref 0.0–0.7)
Eosinophils Relative: 2.9 % (ref 0.0–5.0)
HCT: 45.1 % (ref 36.0–46.0)
Hemoglobin: 14.9 g/dL (ref 12.0–15.0)
Lymphocytes Relative: 33.3 % (ref 12.0–46.0)
Lymphs Abs: 2 10*3/uL (ref 0.7–4.0)
MCHC: 33 g/dL (ref 30.0–36.0)
MCV: 91.8 fl (ref 78.0–100.0)
Monocytes Absolute: 0.4 10*3/uL (ref 0.1–1.0)
Monocytes Relative: 6.7 % (ref 3.0–12.0)
Neutro Abs: 3.4 10*3/uL (ref 1.4–7.7)
Neutrophils Relative %: 56.3 % (ref 43.0–77.0)
Platelets: 256 10*3/uL (ref 150.0–400.0)
RBC: 4.91 Mil/uL (ref 3.87–5.11)
RDW: 13.5 % (ref 11.5–15.5)
WBC: 6 10*3/uL (ref 4.0–10.5)

## 2019-01-03 LAB — COMPREHENSIVE METABOLIC PANEL
ALT: 23 U/L (ref 0–35)
AST: 19 U/L (ref 0–37)
Albumin: 4.6 g/dL (ref 3.5–5.2)
Alkaline Phosphatase: 128 U/L — ABNORMAL HIGH (ref 39–117)
BUN: 15 mg/dL (ref 6–23)
CO2: 30 mEq/L (ref 19–32)
Calcium: 11 mg/dL — ABNORMAL HIGH (ref 8.4–10.5)
Chloride: 105 mEq/L (ref 96–112)
Creatinine, Ser: 0.86 mg/dL (ref 0.40–1.20)
GFR: 69.79 mL/min (ref >60.00)
Glucose, Bld: 80 mg/dL (ref 70–99)
Potassium: 4.7 mEq/L (ref 3.5–5.1)
Sodium: 145 mEq/L (ref 135–145)
Total Bilirubin: 0.5 mg/dL (ref 0.2–1.2)
Total Protein: 7.1 g/dL (ref 6.0–8.3)

## 2019-01-03 LAB — LIPID PANEL
Cholesterol: 183 mg/dL (ref 0–200)
HDL: 57.1 mg/dL (ref >39.00)
LDL Cholesterol: 96 mg/dL (ref 0–99)
NonHDL: 126.15
Total CHOL/HDL Ratio: 3
Triglycerides: 153 mg/dL — ABNORMAL HIGH (ref 0.0–149.0)
VLDL: 30.6 mg/dL (ref 0.0–40.0)

## 2019-01-03 LAB — HEMOGLOBIN A1C: Hgb A1c MFr Bld: 5.3 % (ref 4.6–6.5)

## 2019-01-04 LAB — TSH: TSH: 5.14 u[IU]/mL — ABNORMAL HIGH (ref 0.35–4.50)

## 2019-01-04 LAB — VITAMIN B12: Vitamin B-12: 184 pg/mL — ABNORMAL LOW (ref 211–911)

## 2019-01-04 LAB — HIV ANTIBODY (ROUTINE TESTING W REFLEX): HIV 1&2 Ab, 4th Generation: NONREACTIVE

## 2019-01-04 LAB — T4, FREE: Free T4: 0.87 ng/dL (ref 0.60–1.60)

## 2019-01-05 LAB — VITAMIN D 25 HYDROXY (VIT D DEFICIENCY, FRACTURES): VITD: 28.45 ng/mL — ABNORMAL LOW (ref 30.00–100.00)

## 2019-01-05 NOTE — Progress Notes (Signed)
Subjective: Jenna Pierce is a 53 y.o. is seen today in office s/p right foot gastrocnemius recession, posterior heel spur resection as well as Achilles tenolysis preformed on 11/15/2018.  She states overall she is doing well however on Saturday she was on her foot a lot more than she has been and she did have some increasing swelling and discomfort.  Other than that she has been trying to offload her foot she still using a knee scooter.  She has been wearing the cam boot.  She is been doing some range of motion exercises.  She denies any recent injury or falls and she has no other concerns today.  Denies any fevers, chills, nausea, vomiting.  No calf pain, chest pain, shortness of breath.   Objective: General: No acute distress, AAOx3 -presents with her husband DP/PT pulses palpable 2/4, CRT < 3 sec to all digits.  Protective sensation intact. Motor function intact.  RIGHT foot: Incision appears to be healing well and his scar is well formed.  There is minimal discomfort to palpation of the surgical site there is very minimal edema.  There is no erythema or increase in warmth.  Compton test is negative.  She has adequate muscle strength in all directions.   No other open lesions or pre-ulcerative lesions.  No pain with calf compression, swelling, warmth, erythema.   Assessment and Plan:  Status post right foot surgery, doing well with no complications   -Treatment options discussed including all alternatives, risks, and complications -I want her to continue with range of motion exercises.  She can gradually start to transition to partial weightbearing in the cam boot before going to full weightbearing in the cam boot.  I want her to continue ice elevation.  We will also start physical therapy and a prescription was provided for [facility]. -She does not take any pain medication.  Trula Slade DPM

## 2019-01-06 ENCOUNTER — Encounter: Payer: Self-pay | Admitting: Family Medicine

## 2019-01-10 DIAGNOSIS — M6281 Muscle weakness (generalized): Secondary | ICD-10-CM | POA: Diagnosis not present

## 2019-01-10 DIAGNOSIS — M25571 Pain in right ankle and joints of right foot: Secondary | ICD-10-CM | POA: Diagnosis not present

## 2019-01-10 DIAGNOSIS — R262 Difficulty in walking, not elsewhere classified: Secondary | ICD-10-CM | POA: Diagnosis not present

## 2019-01-10 DIAGNOSIS — S86091D Other specified injury of right Achilles tendon, subsequent encounter: Secondary | ICD-10-CM | POA: Diagnosis not present

## 2019-01-12 ENCOUNTER — Encounter: Payer: Self-pay | Admitting: Family Medicine

## 2019-01-13 ENCOUNTER — Encounter: Payer: Self-pay | Admitting: Family Medicine

## 2019-01-13 DIAGNOSIS — E538 Deficiency of other specified B group vitamins: Secondary | ICD-10-CM | POA: Insufficient documentation

## 2019-01-13 DIAGNOSIS — E038 Other specified hypothyroidism: Secondary | ICD-10-CM | POA: Insufficient documentation

## 2019-01-13 DIAGNOSIS — G4730 Sleep apnea, unspecified: Secondary | ICD-10-CM | POA: Insufficient documentation

## 2019-01-13 DIAGNOSIS — M25571 Pain in right ankle and joints of right foot: Secondary | ICD-10-CM | POA: Diagnosis not present

## 2019-01-13 DIAGNOSIS — E039 Hypothyroidism, unspecified: Secondary | ICD-10-CM | POA: Insufficient documentation

## 2019-01-13 DIAGNOSIS — M6281 Muscle weakness (generalized): Secondary | ICD-10-CM | POA: Diagnosis not present

## 2019-01-13 DIAGNOSIS — S86091D Other specified injury of right Achilles tendon, subsequent encounter: Secondary | ICD-10-CM | POA: Diagnosis not present

## 2019-01-13 DIAGNOSIS — R262 Difficulty in walking, not elsewhere classified: Secondary | ICD-10-CM | POA: Diagnosis not present

## 2019-01-13 NOTE — Assessment & Plan Note (Signed)
Will order sleep study.

## 2019-01-13 NOTE — Assessment & Plan Note (Signed)
Lab Results  Component Value Date   VITAMINB12 184 (L) 01/03/2019   Will offer B12 supplementation.

## 2019-01-13 NOTE — Assessment & Plan Note (Signed)
Lab Results  Component Value Date   TSH 5.14 (H) 01/03/2019   FREET4 0.87 01/03/2019   Lab Results  Component Value Date   TSH 5.14 (H) 01/03/2019   TSH 8.34 (H) 08/25/2017   FREET4 0.87 01/03/2019   FREET4 0.87 08/25/2017

## 2019-01-13 NOTE — Assessment & Plan Note (Signed)
Long discussion today regarding weight loss options.  We discussed medications, diet, exercise, and surgical intervention options.  She is unable to exercise at this point due to her postop boot.

## 2019-01-13 NOTE — Assessment & Plan Note (Signed)
With recent surgical intervention and will continue in the boot for a few more weeks. Inhibits exercise.

## 2019-01-14 ENCOUNTER — Telehealth: Payer: Self-pay | Admitting: Physician Assistant

## 2019-01-14 DIAGNOSIS — M6281 Muscle weakness (generalized): Secondary | ICD-10-CM | POA: Diagnosis not present

## 2019-01-14 DIAGNOSIS — M25571 Pain in right ankle and joints of right foot: Secondary | ICD-10-CM | POA: Diagnosis not present

## 2019-01-14 DIAGNOSIS — S86091D Other specified injury of right Achilles tendon, subsequent encounter: Secondary | ICD-10-CM | POA: Diagnosis not present

## 2019-01-14 DIAGNOSIS — R262 Difficulty in walking, not elsewhere classified: Secondary | ICD-10-CM | POA: Diagnosis not present

## 2019-01-14 DIAGNOSIS — F4323 Adjustment disorder with mixed anxiety and depressed mood: Secondary | ICD-10-CM | POA: Diagnosis not present

## 2019-01-14 MED ORDER — PHENTERMINE HCL 37.5 MG PO TABS: 37.5000 mg | ORAL_TABLET | Freq: Every day | ORAL | 2 refills | Status: DC

## 2019-01-14 MED ORDER — CHOLECALCIFEROL 1.25 MG (50000 UT) PO TABS: ORAL_TABLET | ORAL | 0 refills | Status: DC

## 2019-01-14 NOTE — Telephone Encounter (Signed)
See note  Copied from [place] [id number]. Topic: General - Other >> Jan 14, 2019 10:46 AM Yvette Rack wrote: Reason for CRM: Pt called in for lab results. Pt stated the results are posted on [facility] but she was told that she would be contacted with the results. Pt requests call back after 12:30 pm. Cb# [phone]

## 2019-01-14 NOTE — Telephone Encounter (Signed)
Left message for pt to return our call for her lab results.

## 2019-01-14 NOTE — Telephone Encounter (Signed)
Pt called in for lab results however there was not a result note.   The results are posted on Mychart but there was not a comment or interpretation per pt.  She also had a question about a sleep study being done.   She has not heard anything regarding it.  I called the office [facility] and spoke with [place].   She is going to follow up on this and call the pt back.

## 2019-01-15 DIAGNOSIS — M7660 Achilles tendinitis, unspecified leg: Secondary | ICD-10-CM | POA: Diagnosis not present

## 2019-01-16 MED ORDER — BUPROPION HCL ER (XL) 150 MG PO TB24: 150.0000 mg | ORAL_TABLET | Freq: Every day | ORAL | 2 refills | Status: DC

## 2019-01-16 NOTE — Addendum Note (Signed)
Addended by: Briscoe Deutscher R on: 01/16/2019 08:33 AM   Modules accepted: Orders

## 2019-01-17 ENCOUNTER — Telehealth: Payer: Self-pay | Admitting: *Deleted

## 2019-01-17 DIAGNOSIS — M25571 Pain in right ankle and joints of right foot: Secondary | ICD-10-CM | POA: Diagnosis not present

## 2019-01-17 DIAGNOSIS — R262 Difficulty in walking, not elsewhere classified: Secondary | ICD-10-CM | POA: Diagnosis not present

## 2019-01-17 DIAGNOSIS — M6281 Muscle weakness (generalized): Secondary | ICD-10-CM | POA: Diagnosis not present

## 2019-01-17 DIAGNOSIS — S86091D Other specified injury of right Achilles tendon, subsequent encounter: Secondary | ICD-10-CM | POA: Diagnosis not present

## 2019-01-17 NOTE — Telephone Encounter (Signed)
Left detailed message on voicemail about Phentermine to call office.

## 2019-01-18 ENCOUNTER — Ambulatory Visit (INDEPENDENT_AMBULATORY_CARE_PROVIDER_SITE_OTHER): Payer: BLUE CROSS/BLUE SHIELD

## 2019-01-18 DIAGNOSIS — R262 Difficulty in walking, not elsewhere classified: Secondary | ICD-10-CM | POA: Diagnosis not present

## 2019-01-18 DIAGNOSIS — S86091D Other specified injury of right Achilles tendon, subsequent encounter: Secondary | ICD-10-CM | POA: Diagnosis not present

## 2019-01-18 DIAGNOSIS — E538 Deficiency of other specified B group vitamins: Secondary | ICD-10-CM

## 2019-01-18 DIAGNOSIS — M25571 Pain in right ankle and joints of right foot: Secondary | ICD-10-CM | POA: Diagnosis not present

## 2019-01-18 DIAGNOSIS — M6281 Muscle weakness (generalized): Secondary | ICD-10-CM | POA: Diagnosis not present

## 2019-01-18 MED ORDER — CYANOCOBALAMIN 1000 MCG/ML IJ SOLN
1000.0000 ug | Freq: Once | INTRAMUSCULAR | Status: AC
  Administered 2019-01-18: 1000 ug via INTRAMUSCULAR

## 2019-01-18 NOTE — Progress Notes (Signed)
Per orders of Dr. Wallace, injection of B-12 given by Ladasha Schnackenberg Y Shynia Daleo in left deltoid. Patient tolerated injection well. Patient will make appointment for 1 week.    

## 2019-01-18 NOTE — Telephone Encounter (Signed)
Spoke to pt regarding Phentermine was denied by insurance. Pt said she got it through [facility]. Told her okay good.

## 2019-01-19 NOTE — Telephone Encounter (Signed)
Please see message and advise 

## 2019-01-21 ENCOUNTER — Encounter: Payer: Self-pay | Admitting: Podiatry

## 2019-01-21 ENCOUNTER — Telehealth: Payer: Self-pay | Admitting: Physician Assistant

## 2019-01-21 ENCOUNTER — Ambulatory Visit (INDEPENDENT_AMBULATORY_CARE_PROVIDER_SITE_OTHER): Payer: BLUE CROSS/BLUE SHIELD | Admitting: Podiatry

## 2019-01-21 DIAGNOSIS — R262 Difficulty in walking, not elsewhere classified: Secondary | ICD-10-CM | POA: Diagnosis not present

## 2019-01-21 DIAGNOSIS — M25571 Pain in right ankle and joints of right foot: Secondary | ICD-10-CM | POA: Diagnosis not present

## 2019-01-21 DIAGNOSIS — M6281 Muscle weakness (generalized): Secondary | ICD-10-CM | POA: Diagnosis not present

## 2019-01-21 DIAGNOSIS — M7661 Achilles tendinitis, right leg: Secondary | ICD-10-CM

## 2019-01-21 DIAGNOSIS — M7731 Calcaneal spur, right foot: Secondary | ICD-10-CM

## 2019-01-21 DIAGNOSIS — S86091D Other specified injury of right Achilles tendon, subsequent encounter: Secondary | ICD-10-CM | POA: Diagnosis not present

## 2019-01-21 NOTE — Telephone Encounter (Signed)
Copied from [place] [id number]. Topic: Quick Communication - See Telephone Encounter >> Jan 21, 2019  4:19 PM Sheran Luz wrote: CRM for notification. See Telephone encounter for: 01/21/19.  Patient would like to know if she is to continue taking escitalopram (LEXAPRO) 10 MG tablet, as a new medication buPROPion (WELLBUTRIN XL) 150 MG 24 hr tablet was sent in on 3/1.

## 2019-01-21 NOTE — Telephone Encounter (Signed)
See note

## 2019-01-21 NOTE — Telephone Encounter (Signed)
Dr Wallace please see message and advise. 

## 2019-01-22 NOTE — Telephone Encounter (Signed)
Yes. They work well together.

## 2019-01-23 NOTE — Progress Notes (Signed)
Subjective: Jenna Pierce is a 53 y.o. is seen today in office s/p right foot gastrocnemius recession, posterior heel spur resection as well as Achilles tenolysis preformed on 11/15/2018.  Overall she states that she is doing well.  She is gone to physical therapy and this is been helpful.  She is walking in the cam boot now.  She states that she has some intermittent discomfort but overall her symptoms are much improved.  After last saw her she did fall she had a bruise to the back of her calf however she went to physical therapy and everything seemed to be intact.  The symptoms have pretty much resolved.  She has no other concerns today.  No recent injury or falls or any changes since I last saw her.  She denies any fevers, chills, nausea vomiting.  No calf pain, chest pain, shortness of breath.  Objective: General: No acute distress, AAOx3 -presents with her husband DP/PT pulses palpable 2/4, CRT < 3 sec to all digits.  Protective sensation intact. Motor function intact.  RIGHT foot: Incision appears to be doing well the scar is well formed.  There is no surrounding erythema, ascending cellulitis.  No fluctuation or crepitation or any clinical signs of infection or dehiscence.  Very minimal swelling to the area.  There is no pain with lateral compression of calcaneus.  MMT 5/5.  Thompson test is negative. No other open lesions or pre-ulcerative lesions.  No pain with calf compression, swelling, warmth, erythema.   Assessment and Plan:  Status post right foot surgery, doing well with no complications   -Treatment options discussed including all alternatives, risks, and complications -She is continued to do much better.  She is walking in the boot.  She did start to drive today and she is feeling comfortable doing this.  Continue physical therapy.  She is going to [employer] tomorrow and I want her to be very careful and her limit her weightbearing with a cam boot.  When she gets back we can start  to transition to a regular shoe, however gradually.  Trula Slade DPM

## 2019-01-24 ENCOUNTER — Telehealth: Payer: Self-pay

## 2019-01-24 NOTE — Telephone Encounter (Signed)
Alexianna Anacker Key: [id number] - PA Case ID: [id number] - Rx #: [id number]  Need help? Call us at [phone]    Status  Sent to [employer]   Drug Phentermine HCl 37.5MG  tablets Form [employer] Electronic PA Form (NCPDP)   Original Claim Info 24 PA REQ MD- [email or web address] HELP DESK [phone])

## 2019-01-24 NOTE — Telephone Encounter (Signed)
Left detailed message on personal voicemail, Dr. Juleen China said to take Lexapro as well as the new medication Wellbutrin. She said they work well together. Any questions please call office.

## 2019-01-27 ENCOUNTER — Encounter: Payer: Self-pay | Admitting: Pulmonary Disease

## 2019-01-27 ENCOUNTER — Ambulatory Visit (INDEPENDENT_AMBULATORY_CARE_PROVIDER_SITE_OTHER): Payer: BLUE CROSS/BLUE SHIELD | Admitting: *Deleted

## 2019-01-27 ENCOUNTER — Ambulatory Visit (INDEPENDENT_AMBULATORY_CARE_PROVIDER_SITE_OTHER): Payer: BLUE CROSS/BLUE SHIELD | Admitting: Pulmonary Disease

## 2019-01-27 ENCOUNTER — Other Ambulatory Visit: Payer: Self-pay

## 2019-01-27 VITALS — BP 120/84 | HR 89 | Ht 67.0 in | Wt 264.0 lb

## 2019-01-27 DIAGNOSIS — E538 Deficiency of other specified B group vitamins: Secondary | ICD-10-CM | POA: Diagnosis not present

## 2019-01-27 DIAGNOSIS — R0683 Snoring: Secondary | ICD-10-CM | POA: Diagnosis not present

## 2019-01-27 DIAGNOSIS — G4730 Sleep apnea, unspecified: Secondary | ICD-10-CM | POA: Diagnosis not present

## 2019-01-27 DIAGNOSIS — F4323 Adjustment disorder with mixed anxiety and depressed mood: Secondary | ICD-10-CM | POA: Diagnosis not present

## 2019-01-27 MED ORDER — CYANOCOBALAMIN 1000 MCG/ML IJ SOLN
1000.0000 ug | Freq: Once | INTRAMUSCULAR | Status: AC
  Administered 2019-01-27: 1000 ug via INTRAMUSCULAR

## 2019-01-27 NOTE — Assessment & Plan Note (Signed)
Weight loss encouraged.  She also has some depression and insomnia and would do well with an antidepressant with sedative properties such as trazodone

## 2019-01-27 NOTE — Progress Notes (Signed)
Per orders of Inda Coke, [place], injection of Cyanocobalamin given by Anselmo Pickler, LPN in right deltoid IM. Patient tolerated injection well. Patient will make appointment for 1 week.

## 2019-01-27 NOTE — Patient Instructions (Signed)
Home sleep study 

## 2019-01-27 NOTE — Assessment & Plan Note (Signed)
Given excessive daytime somnolence, narrow pharyngeal exam, witnessed apneas & loud snoring, obstructive sleep apnea is very likely & an overnight polysomnogram will be scheduled as a home study. The pathophysiology of obstructive sleep apnea , it's cardiovascular consequences & modes of treatment including CPAP were discused with the patient in detail & they evidenced understanding.  It is probability is high.  She would be agreeable to using a CPAP machine with nasal pillows since she is claustrophobic.

## 2019-01-27 NOTE — Progress Notes (Signed)
Subjective:    Patient ID: Jenna Pierce, female    DOB: June 27, 1966, 53 y.o.   MRN: [id number]  HPI  53 year old woman presents for evaluation of sleep disordered breathing. Family members have noted loud snoring.  She also reports frequent nocturnal awakenings and non-refreshing sleep. Epworth sleepiness score is 13 and she reports sleepiness while sitting and reading, watching TV, lying down to rest in the afternoons or as a passenger in a car. Bedtime is between 11:30 PM and 1 AM, sleep latency can be about 20 minutes, she starts of sleeping on her back but generally rolls over to her side, uses one pillow.  She reports 1-3 nocturnal awakenings and often wakes up between 3 and 5 aM.  Her mind is racing and she has difficulty falling back asleep.  She is out of bed by 7:15 AM feeling tired with occasional headaches and dryness of mouth. She has gained 50 pounds over the last 5 years, TSH is borderline high.  She reports drinking 2 cups of coffee and 2 cups of sweet tea daily. She does grind her teeth and was given a mouthguard by dentist but has not used this in years  There is no history suggestive of cataplexy, sleep paralysis or parasomnias  There is some history of depression surrounding father's death and she is on Wellbutrin now.  Was on Lexapro prior.  Her dad had OSA used a CPAP machine  Past Medical History:  Diagnosis Date  . Ovarian cyst     Past Surgical History:  Procedure Laterality Date  . ABDOMINAL HYSTERECTOMY    . BUNIONECTOMY Bilateral 2017   done 4 weeks apart  . distal radius repair Left 2016  . FRACTURE SURGERY    . pin and repair 5th metacarpal  2017  . ROBOTIC ASSISTED TOTAL HYSTERECTOMY WITH BILATERAL SALPINGO OOPHERECTOMY  08/24/2015   Performed [facility] Nancy Marus at [facility]  . WRIST FRACTURE SURGERY Left 02/26/2015   hardware and screws placed    No Known Allergies  Social History   Socioeconomic History  . Marital status:  Married    Spouse name: Not on file  . Number of children: Not on file  . Years of education: Not on file  . Highest education level: Not on file  Occupational History  . Not on file  Social Needs  . Financial resource strain: Not on file  . Food insecurity:    Worry: Not on file    Inability: Not on file  . Transportation needs:    Medical: Not on file    Non-medical: Not on file  Tobacco Use  . Smoking status: Never Smoker  . Smokeless tobacco: Never Used  Substance and Sexual Activity  . Alcohol use: Yes    Comment: 3 drinks a week wine or beer  . Drug use: No  . Sexual activity: Yes  Lifestyle  . Physical activity:    Days per week: Not on file    Minutes per session: Not on file  . Stress: Not on file  Relationships  . Social connections:    Talks on phone: Not on file    Gets together: Not on file    Attends religious service: Not on file    Active member of club or organization: Not on file    Attends meetings of clubs or organizations: Not on file    Relationship status: Not on file  . Intimate partner violence:    Fear of current or  ex partner: Not on file    Emotionally abused: Not on file    Physically abused: Not on file    Forced sexual activity: Not on file  Other Topics Concern  . Not on file  Social History Narrative   Film/video editor for [employer]   2 children   Married   Active in her church     Family History  Problem Relation Age of Onset  . Diabetes Father   . Arthritis Father   . Hyperlipidemia Father   . Hypertension Father   . Heart attack Father   . Breast cancer Paternal Grandmother   . Arthritis Mother   . COPD Mother   . Depression Mother   . Alzheimer's disease Mother   . Alcohol abuse Maternal Grandfather      Review of Systems  Constitutional: Positive for unexpected weight change.  HENT: Positive for dental problem.   Musculoskeletal: Positive for joint swelling.  Neurological: Positive for headaches.   Psychiatric/Behavioral: The patient is nervous/anxious.        Objective:   Physical Exam  Gen. Pleasant, obese, in no distress, normal affect ENT - no pallor,icterus, no post nasal drip, class 2 airway, nl bite Neck: No JVD, no thyromegaly, no carotid bruits Lungs: no use of accessory muscles, no dullness to percussion, decreased without rales or rhonchi  Cardiovascular: Rhythm regular, heart sounds  normal, no murmurs or gallops, no peripheral edema Abdomen: soft and non-tender, no hepatosplenomegaly, BS normal. Musculoskeletal: No deformities, no cyanosis or clubbing Neuro:  alert, non focal, no tremors       Assessment & Plan:

## 2019-01-31 DIAGNOSIS — R262 Difficulty in walking, not elsewhere classified: Secondary | ICD-10-CM | POA: Diagnosis not present

## 2019-01-31 DIAGNOSIS — S86091D Other specified injury of right Achilles tendon, subsequent encounter: Secondary | ICD-10-CM | POA: Diagnosis not present

## 2019-01-31 DIAGNOSIS — M6281 Muscle weakness (generalized): Secondary | ICD-10-CM | POA: Diagnosis not present

## 2019-01-31 DIAGNOSIS — M25571 Pain in right ankle and joints of right foot: Secondary | ICD-10-CM | POA: Diagnosis not present

## 2019-02-03 ENCOUNTER — Other Ambulatory Visit: Payer: Self-pay

## 2019-02-03 ENCOUNTER — Ambulatory Visit (INDEPENDENT_AMBULATORY_CARE_PROVIDER_SITE_OTHER): Payer: BLUE CROSS/BLUE SHIELD

## 2019-02-03 DIAGNOSIS — E538 Deficiency of other specified B group vitamins: Secondary | ICD-10-CM

## 2019-02-03 MED ORDER — CYANOCOBALAMIN 1000 MCG/ML IJ SOLN
1000.0000 ug | Freq: Once | INTRAMUSCULAR | Status: AC
  Administered 2019-02-03: 1000 ug via INTRAMUSCULAR

## 2019-02-03 NOTE — Progress Notes (Signed)
Per orders of Inda Coke, [place], injection of B-12 given by Francella Solian in left deltoid. Patient tolerated injection well. Patient will make appointment for 61month

## 2019-02-03 NOTE — Telephone Encounter (Signed)
Form received for more information for auth completed and faxed. Will send to scan

## 2019-02-05 ENCOUNTER — Other Ambulatory Visit: Payer: Self-pay | Admitting: Family Medicine

## 2019-02-05 DIAGNOSIS — F418 Other specified anxiety disorders: Secondary | ICD-10-CM

## 2019-02-08 DIAGNOSIS — M6281 Muscle weakness (generalized): Secondary | ICD-10-CM | POA: Diagnosis not present

## 2019-02-08 DIAGNOSIS — M25571 Pain in right ankle and joints of right foot: Secondary | ICD-10-CM | POA: Diagnosis not present

## 2019-02-08 DIAGNOSIS — S86091D Other specified injury of right Achilles tendon, subsequent encounter: Secondary | ICD-10-CM | POA: Diagnosis not present

## 2019-02-08 DIAGNOSIS — R262 Difficulty in walking, not elsewhere classified: Secondary | ICD-10-CM | POA: Diagnosis not present

## 2019-02-09 ENCOUNTER — Telehealth: Payer: Self-pay

## 2019-02-09 DIAGNOSIS — F4323 Adjustment disorder with mixed anxiety and depressed mood: Secondary | ICD-10-CM | POA: Diagnosis not present

## 2019-02-09 NOTE — Telephone Encounter (Signed)
Copied from [place] [id number] [id number]. Topic: Conservator, museum/gallery Patient (Clinic Use ONLY) >> Feb 09, 2019 12:09 PM Monteen Toops, Tyler Pita, CMA wrote: Reason for CRM: Patient needs to cancel nurse visit for B12 injection scheduled for 02/10/2019.  Take oral vitamin B12 1000 mcg daily for 1 month and then call and reschedule nurse visit for B12 injection in 1 month. >> Feb 09, 2019 12:25 PM Scherrie Gerlach wrote: Pt would like to know  she is willing to take the chance and come into the office. Pt states this is her last B12 and she was not going to have to come back for a month.  Pt does not want to take a daily pill since she is so close. Would be willing to be prescreened, but really wants to come to office for the injection

## 2019-02-09 NOTE — Telephone Encounter (Signed)
Spoke to pt told her I can give her B12 injection early tomorrow morning at 8:00 AM if that is okay. Pt said that is fine. Okay see you then. Pt verbalized understanding.

## 2019-02-09 NOTE — Telephone Encounter (Signed)
Please advise.  Tried to reschedule patient's B12 injection.

## 2019-02-10 ENCOUNTER — Other Ambulatory Visit: Payer: Self-pay

## 2019-02-10 ENCOUNTER — Ambulatory Visit (INDEPENDENT_AMBULATORY_CARE_PROVIDER_SITE_OTHER): Payer: BLUE CROSS/BLUE SHIELD | Admitting: *Deleted

## 2019-02-10 ENCOUNTER — Ambulatory Visit: Payer: BLUE CROSS/BLUE SHIELD

## 2019-02-10 ENCOUNTER — Encounter: Payer: Self-pay | Admitting: *Deleted

## 2019-02-10 DIAGNOSIS — M6281 Muscle weakness (generalized): Secondary | ICD-10-CM | POA: Diagnosis not present

## 2019-02-10 DIAGNOSIS — R262 Difficulty in walking, not elsewhere classified: Secondary | ICD-10-CM | POA: Diagnosis not present

## 2019-02-10 DIAGNOSIS — E538 Deficiency of other specified B group vitamins: Secondary | ICD-10-CM | POA: Diagnosis not present

## 2019-02-10 DIAGNOSIS — M25571 Pain in right ankle and joints of right foot: Secondary | ICD-10-CM | POA: Diagnosis not present

## 2019-02-10 DIAGNOSIS — S86091D Other specified injury of right Achilles tendon, subsequent encounter: Secondary | ICD-10-CM | POA: Diagnosis not present

## 2019-02-10 MED ORDER — CYANOCOBALAMIN 1000 MCG/ML IJ SOLN
1000.0000 ug | Freq: Once | INTRAMUSCULAR | Status: AC
  Administered 2019-02-10: 1000 ug via INTRAMUSCULAR

## 2019-02-10 NOTE — Progress Notes (Addendum)
Per orders of Inda Coke, [place], injection of Cyanocobalamin 1000 mcg given in Right Deltoid IM by Anselmo Pickler, LPN Patient tolerated injection well. Patient will make appointment for 1 month.  I agree with the above information, assessment and plan.  Inda Coke PA-C

## 2019-02-11 ENCOUNTER — Ambulatory Visit: Payer: BLUE CROSS/BLUE SHIELD | Admitting: Podiatry

## 2019-02-12 ENCOUNTER — Other Ambulatory Visit: Payer: Self-pay | Admitting: Family Medicine

## 2019-02-13 DIAGNOSIS — M7730 Calcaneal spur, unspecified foot: Secondary | ICD-10-CM | POA: Diagnosis not present

## 2019-02-13 DIAGNOSIS — M7660 Achilles tendinitis, unspecified leg: Secondary | ICD-10-CM | POA: Diagnosis not present

## 2019-02-15 DIAGNOSIS — R262 Difficulty in walking, not elsewhere classified: Secondary | ICD-10-CM | POA: Diagnosis not present

## 2019-02-15 DIAGNOSIS — M25571 Pain in right ankle and joints of right foot: Secondary | ICD-10-CM | POA: Diagnosis not present

## 2019-02-15 DIAGNOSIS — M6281 Muscle weakness (generalized): Secondary | ICD-10-CM | POA: Diagnosis not present

## 2019-02-15 DIAGNOSIS — S86091D Other specified injury of right Achilles tendon, subsequent encounter: Secondary | ICD-10-CM | POA: Diagnosis not present

## 2019-02-17 DIAGNOSIS — M25571 Pain in right ankle and joints of right foot: Secondary | ICD-10-CM | POA: Diagnosis not present

## 2019-02-17 DIAGNOSIS — R262 Difficulty in walking, not elsewhere classified: Secondary | ICD-10-CM | POA: Diagnosis not present

## 2019-02-17 DIAGNOSIS — M6281 Muscle weakness (generalized): Secondary | ICD-10-CM | POA: Diagnosis not present

## 2019-02-17 DIAGNOSIS — S86091D Other specified injury of right Achilles tendon, subsequent encounter: Secondary | ICD-10-CM | POA: Diagnosis not present

## 2019-02-21 ENCOUNTER — Encounter: Payer: Self-pay | Admitting: Podiatry

## 2019-02-21 ENCOUNTER — Telehealth: Payer: Self-pay | Admitting: Podiatry

## 2019-02-21 ENCOUNTER — Ambulatory Visit (INDEPENDENT_AMBULATORY_CARE_PROVIDER_SITE_OTHER): Payer: BLUE CROSS/BLUE SHIELD | Admitting: Podiatry

## 2019-02-21 ENCOUNTER — Other Ambulatory Visit: Payer: Self-pay

## 2019-02-21 VITALS — Temp 97.7°F

## 2019-02-21 DIAGNOSIS — M7731 Calcaneal spur, right foot: Secondary | ICD-10-CM

## 2019-02-21 DIAGNOSIS — M722 Plantar fascial fibromatosis: Secondary | ICD-10-CM

## 2019-02-21 DIAGNOSIS — M7661 Achilles tendinitis, right leg: Secondary | ICD-10-CM

## 2019-02-21 NOTE — Telephone Encounter (Signed)
Patient was seen in office today and was going to have a prescription for a 6 day steroid sent in to her pharmacy but they have not received the prescription. Patient calling to follow up.

## 2019-02-21 NOTE — Patient Instructions (Signed)

## 2019-02-22 DIAGNOSIS — M6281 Muscle weakness (generalized): Secondary | ICD-10-CM | POA: Diagnosis not present

## 2019-02-22 DIAGNOSIS — R262 Difficulty in walking, not elsewhere classified: Secondary | ICD-10-CM | POA: Diagnosis not present

## 2019-02-22 DIAGNOSIS — S86091D Other specified injury of right Achilles tendon, subsequent encounter: Secondary | ICD-10-CM | POA: Diagnosis not present

## 2019-02-22 DIAGNOSIS — M25571 Pain in right ankle and joints of right foot: Secondary | ICD-10-CM | POA: Diagnosis not present

## 2019-02-22 MED ORDER — METHYLPREDNISOLONE 4 MG PO TBPK: ORAL_TABLET | ORAL | 0 refills | Status: DC

## 2019-02-22 NOTE — Telephone Encounter (Signed)
I called pt and she states she has picked the medication up.

## 2019-02-22 NOTE — Telephone Encounter (Signed)
sent 

## 2019-02-24 NOTE — Progress Notes (Signed)
Subjective: Jenna Pierce is a 53 y.o. is seen today in office s/p right foot gastrocnemius recession, posterior heel spur resection as well as Achilles tenolysis preformed on 11/15/2018.  Overall she states that from a surgical standpoint she is been doing well but she has been experiencing pain to the bottom of the heel.  She has been going to physical therapy 2 times a week still.  She has pain about her heel and she does little walking or standing.  She denies any recent injury or falls.  No increase in swelling or redness.  She is still wearing a shoe with improvement back.  She denies any fevers, chills, nausea vomiting.  No calf pain, chest pain, shortness of breath.  Objective: General: No acute distress, AAOx3 -presents with her husband DP/PT pulses palpable 2/4, CRT < 3 sec to all digits.  Protective sensation intact. Motor function intact.  RIGHT foot: Incision appears to be doing well the scar is well formed.  There is no surrounding erythema, ascending cellulitis.  No fluctuation or crepitation or any clinical signs of infection or dehiscence.  Minimal discomfort to the posterior aspect of the calcaneus.  Majority tenderness appears to be on plantar central aspect of the heel.  There is no pain upon compression of calcaneus.  There is no edema to the plantar calcaneus.  Thompson test is negative.  No other areas of tenderness elicited at this time. LEFT foot: She is a chronic pain to the posterior aspect of the heel as well.  Also she is been putting more pressure on the left foot which is made her symptoms somewhat increased at times. No other open lesions or pre-ulcerative lesions.  No pain with calf compression, swelling, warmth, erythema.   Assessment and Plan:  Status post right foot surgery, doing well with no complications   -Treatment options discussed including all alternatives, risks, and complications -She started experiencing is more the plantar aspect the heel plantar  fasciitis symptoms.  Discussed a steroid injection she was hesitant to this.  Given overall tenderness prescribed a Medrol Dosepak.  Plantar fascial brace.  Continue physical therapy.  Ice to the area as well.  Continue with supportive shoes and orthotics.  Trula Slade DPM

## 2019-03-02 DIAGNOSIS — F4323 Adjustment disorder with mixed anxiety and depressed mood: Secondary | ICD-10-CM | POA: Diagnosis not present

## 2019-03-03 DIAGNOSIS — R262 Difficulty in walking, not elsewhere classified: Secondary | ICD-10-CM | POA: Diagnosis not present

## 2019-03-03 DIAGNOSIS — M25571 Pain in right ankle and joints of right foot: Secondary | ICD-10-CM | POA: Diagnosis not present

## 2019-03-03 DIAGNOSIS — M6281 Muscle weakness (generalized): Secondary | ICD-10-CM | POA: Diagnosis not present

## 2019-03-03 DIAGNOSIS — S86091D Other specified injury of right Achilles tendon, subsequent encounter: Secondary | ICD-10-CM | POA: Diagnosis not present

## 2019-03-07 ENCOUNTER — Telehealth: Payer: Self-pay | Admitting: Physician Assistant

## 2019-03-07 DIAGNOSIS — R262 Difficulty in walking, not elsewhere classified: Secondary | ICD-10-CM | POA: Diagnosis not present

## 2019-03-07 DIAGNOSIS — S86091D Other specified injury of right Achilles tendon, subsequent encounter: Secondary | ICD-10-CM | POA: Diagnosis not present

## 2019-03-07 DIAGNOSIS — M25571 Pain in right ankle and joints of right foot: Secondary | ICD-10-CM | POA: Diagnosis not present

## 2019-03-07 DIAGNOSIS — M6281 Muscle weakness (generalized): Secondary | ICD-10-CM | POA: Diagnosis not present

## 2019-03-07 NOTE — Telephone Encounter (Signed)
Based on all of her labs, I would wait until at least mid-May to re-check everything unless there is something else she is concerned about?  Would she be comfortable learning how to do B12 injections so she doesn't have to keep coming into the office?  I would be okay with offering B12 injection car visit otherwise.

## 2019-03-07 NOTE — Telephone Encounter (Signed)
Pt would like to know when she can come in for her b12 she is due for and labs as well. Please advise. Cancelled CPE scheduled for 03/08/2019

## 2019-03-07 NOTE — Telephone Encounter (Signed)
Please see message and advise 

## 2019-03-08 ENCOUNTER — Encounter: Payer: BLUE CROSS/BLUE SHIELD | Admitting: Physician Assistant

## 2019-03-08 NOTE — Telephone Encounter (Signed)
Spoke to pt told her Jenna Pierce said would wait till mid May to recheck labs. Also would you be comfortable learning how to do B12 Injection or keep coming to the office and we can do a car visit? Pt said she would prefer car visit. Told pt okay, Appt schedule for Thurs at 11:00 am. Pt verbalized understanding.

## 2019-03-09 ENCOUNTER — Encounter: Payer: Self-pay | Admitting: Physician Assistant

## 2019-03-09 ENCOUNTER — Ambulatory Visit (INDEPENDENT_AMBULATORY_CARE_PROVIDER_SITE_OTHER): Payer: BLUE CROSS/BLUE SHIELD | Admitting: Physician Assistant

## 2019-03-09 DIAGNOSIS — S86091D Other specified injury of right Achilles tendon, subsequent encounter: Secondary | ICD-10-CM | POA: Diagnosis not present

## 2019-03-09 DIAGNOSIS — R262 Difficulty in walking, not elsewhere classified: Secondary | ICD-10-CM | POA: Diagnosis not present

## 2019-03-09 DIAGNOSIS — E559 Vitamin D deficiency, unspecified: Secondary | ICD-10-CM

## 2019-03-09 DIAGNOSIS — E538 Deficiency of other specified B group vitamins: Secondary | ICD-10-CM | POA: Diagnosis not present

## 2019-03-09 DIAGNOSIS — Z6841 Body Mass Index (BMI) 40.0 and over, adult: Secondary | ICD-10-CM | POA: Diagnosis not present

## 2019-03-09 DIAGNOSIS — M25571 Pain in right ankle and joints of right foot: Secondary | ICD-10-CM | POA: Diagnosis not present

## 2019-03-09 DIAGNOSIS — M6281 Muscle weakness (generalized): Secondary | ICD-10-CM | POA: Diagnosis not present

## 2019-03-09 NOTE — Progress Notes (Signed)
Virtual Visit via Video   I connected with Jenna Pierce on 03/09/19 at  3:40 PM EDT by a video enabled telemedicine application and verified that I am speaking with the correct person using two identifiers. Location patient: Home Location provider: [facility], Office Persons participating in the virtual visit: Giavana Almond Mazer, [place], [place]   I discussed the limitations of evaluation and management by telemedicine and the availability of in person appointments. The patient expressed understanding and agreed to proceed.  Subjective:   HPI:   Obesity/B12 and D deficiency She is here to discuss weight loss. Past efforts include: phentermine (didn't feel like it helped suppress her appetite) and wellbutrin. She is presently on Wellbutrin and feels as though this medication is working well for her. She does have hx of subclinical hypothyroidism, labs most recently checked about 2 months ago. She is doing well with B12 injections and vit D supplement.  Wt Readings from Last 5 Encounters:  01/27/19 264 lb (119.7 kg)  01/03/19 264 lb 9.6 oz (120 kg)  02/23/18 258 lb 3.2 oz (117.1 kg)  02/02/18 256 lb 6.4 oz (116.3 kg)  08/21/17 254 lb 8 oz (115.4 kg)     ROS: See pertinent positives and negatives per HPI.  Patient Active Problem List   Diagnosis Date Noted  . Subclinical hypothyroidism 01/13/2019  . Sleep disorder breathing 01/13/2019  . B12 deficiency 01/13/2019  . Situational anxiety 02/23/2018  . Morbid obesity ([place]) 02/23/2018  . Achilles tendinitis of both lower extremities 02/23/2018    Social History   Tobacco Use  . Smoking status: Never Smoker  . Smokeless tobacco: Never Used  Substance Use Topics  . Alcohol use: Yes    Comment: 3 drinks a week wine or beer    Current Outpatient Medications:  .  buPROPion (WELLBUTRIN XL) 150 MG 24 hr tablet, TAKE 1 TABLET BY MOUTH EVERY DAY, Disp: 90 tablet, Rfl: 1 .  Cholecalciferol 1.25 MG (50000 UT) TABS, 50,000  units PO qwk for 12 weeks., Disp: 12 tablet, Rfl: 0  No Known Allergies  Objective:   VITALS: Per patient if applicable, see vitals. GENERAL: Alert, appears well and in no acute distress. HEENT: Atraumatic, conjunctiva clear, no obvious abnormalities on inspection of external nose and ears. NECK: Normal movements of the head and neck. CARDIOPULMONARY: No increased WOB. Speaking in clear sentences. I:E ratio WNL.  MS: Moves all visible extremities without noticeable abnormality. PSYCH: Pleasant and cooperative, well-groomed. Speech normal rate and rhythm. Affect is appropriate. Insight and judgement are appropriate. Attention is focused, linear, and appropriate.  NEURO: CN grossly intact. Oriented as arrived to appointment on time with no prompting. Moves both UE equally.  SKIN: No obvious lesions, wounds, erythema, or cyanosis noted on face or hands.  Assessment and Plan:   [place] was seen today for weight loss.  Diagnoses and all orders for this visit:  B12 deficiency -     Vitamin B12  Vitamin D deficiency -     VITAMIN D 25 Hydroxy (Vit-D Deficiency, Fractures)  Morbid obesity with BMI of 40.0-44.9, adult ([place]) She is interested in starting medications for weight loss. Options discussed include: increasing wellbutrin, starting qsymia or trulicity or saxenda. Will check fasting insulin and repeat thyroid labs per patient request. -     Insulin, Free (Bioactive)-([facility]) -     TSH -     T4, free   . Reviewed expectations re: course of current medical issues. . Discussed self-management of  symptoms. . Outlined signs and symptoms indicating need for more acute intervention. . Patient verbalized understanding and all questions were answered. Marland Kitchen Health Maintenance issues including appropriate healthy diet, exercise, and smoking avoidance were discussed with patient. . See orders for this visit as documented in the electronic medical record.  I discussed the assessment and  treatment plan with the patient. The patient was provided an opportunity to ask questions and all were answered. The patient agreed with the plan and demonstrated an understanding of the instructions.   The patient was advised to call back or seek an in-person evaluation if the symptoms worsen or if the condition fails to improve as anticipated.    [place], [place] 03/09/2019

## 2019-03-10 ENCOUNTER — Other Ambulatory Visit: Payer: Self-pay

## 2019-03-10 ENCOUNTER — Encounter: Payer: Self-pay | Admitting: Physician Assistant

## 2019-03-10 ENCOUNTER — Ambulatory Visit (INDEPENDENT_AMBULATORY_CARE_PROVIDER_SITE_OTHER): Payer: BLUE CROSS/BLUE SHIELD | Admitting: Physician Assistant

## 2019-03-10 DIAGNOSIS — Z6841 Body Mass Index (BMI) 40.0 and over, adult: Secondary | ICD-10-CM | POA: Diagnosis not present

## 2019-03-10 DIAGNOSIS — E538 Deficiency of other specified B group vitamins: Secondary | ICD-10-CM | POA: Diagnosis not present

## 2019-03-10 LAB — VITAMIN B12: Vitamin B-12: 267 pg/mL (ref 211–911)

## 2019-03-10 LAB — VITAMIN D 25 HYDROXY (VIT D DEFICIENCY, FRACTURES): VITD: 39.53 ng/mL (ref 30.00–100.00)

## 2019-03-10 LAB — TSH: TSH: 5.22 u[IU]/mL — ABNORMAL HIGH (ref 0.35–4.50)

## 2019-03-10 LAB — T4, FREE: Free T4: 0.87 ng/dL (ref 0.60–1.60)

## 2019-03-10 MED ORDER — CYANOCOBALAMIN 1000 MCG/ML IJ SOLN
1000.0000 ug | Freq: Once | INTRAMUSCULAR | Status: AC
  Administered 2019-03-10: 1000 ug via INTRAMUSCULAR

## 2019-03-10 NOTE — Progress Notes (Addendum)
Per orders of Inda Coke, [place], injection of Cyanocobalamin 1000 mcg given by Anselmo Pickler, LPN in left deltoid IM.  Patient tolerated injection well. Patient will make appointment for 68month  I agree with the above assessment and plan. Inda Coke PA-C 03/10/19

## 2019-03-14 LAB — INSULIN, FREE (BIOACTIVE): Insulin, Free: 35.8 u[IU]/mL — ABNORMAL HIGH (ref 1.5–14.9)

## 2019-03-16 DIAGNOSIS — M25571 Pain in right ankle and joints of right foot: Secondary | ICD-10-CM | POA: Diagnosis not present

## 2019-03-16 DIAGNOSIS — M6281 Muscle weakness (generalized): Secondary | ICD-10-CM | POA: Diagnosis not present

## 2019-03-16 DIAGNOSIS — S86091D Other specified injury of right Achilles tendon, subsequent encounter: Secondary | ICD-10-CM | POA: Diagnosis not present

## 2019-03-16 DIAGNOSIS — F4323 Adjustment disorder with mixed anxiety and depressed mood: Secondary | ICD-10-CM | POA: Diagnosis not present

## 2019-03-16 DIAGNOSIS — R262 Difficulty in walking, not elsewhere classified: Secondary | ICD-10-CM | POA: Diagnosis not present

## 2019-03-17 DIAGNOSIS — M7730 Calcaneal spur, unspecified foot: Secondary | ICD-10-CM | POA: Diagnosis not present

## 2019-03-17 DIAGNOSIS — M7660 Achilles tendinitis, unspecified leg: Secondary | ICD-10-CM | POA: Diagnosis not present

## 2019-03-18 DIAGNOSIS — S86091D Other specified injury of right Achilles tendon, subsequent encounter: Secondary | ICD-10-CM | POA: Diagnosis not present

## 2019-03-18 DIAGNOSIS — R262 Difficulty in walking, not elsewhere classified: Secondary | ICD-10-CM | POA: Diagnosis not present

## 2019-03-18 DIAGNOSIS — M6281 Muscle weakness (generalized): Secondary | ICD-10-CM | POA: Diagnosis not present

## 2019-03-18 DIAGNOSIS — M25571 Pain in right ankle and joints of right foot: Secondary | ICD-10-CM | POA: Diagnosis not present

## 2019-03-21 DIAGNOSIS — M25571 Pain in right ankle and joints of right foot: Secondary | ICD-10-CM | POA: Diagnosis not present

## 2019-03-21 DIAGNOSIS — M6281 Muscle weakness (generalized): Secondary | ICD-10-CM | POA: Diagnosis not present

## 2019-03-21 DIAGNOSIS — S86091D Other specified injury of right Achilles tendon, subsequent encounter: Secondary | ICD-10-CM | POA: Diagnosis not present

## 2019-03-21 DIAGNOSIS — R262 Difficulty in walking, not elsewhere classified: Secondary | ICD-10-CM | POA: Diagnosis not present

## 2019-03-22 ENCOUNTER — Other Ambulatory Visit: Payer: Self-pay

## 2019-03-22 ENCOUNTER — Encounter: Payer: Self-pay | Admitting: Podiatry

## 2019-03-22 ENCOUNTER — Ambulatory Visit (INDEPENDENT_AMBULATORY_CARE_PROVIDER_SITE_OTHER): Payer: BLUE CROSS/BLUE SHIELD | Admitting: Podiatry

## 2019-03-22 VITALS — Temp 97.5°F

## 2019-03-22 DIAGNOSIS — M722 Plantar fascial fibromatosis: Secondary | ICD-10-CM

## 2019-03-22 DIAGNOSIS — Z1231 Encounter for screening mammogram for malignant neoplasm of breast: Secondary | ICD-10-CM | POA: Diagnosis not present

## 2019-03-22 DIAGNOSIS — M7661 Achilles tendinitis, right leg: Secondary | ICD-10-CM

## 2019-03-23 ENCOUNTER — Ambulatory Visit: Payer: BLUE CROSS/BLUE SHIELD

## 2019-03-23 ENCOUNTER — Other Ambulatory Visit: Payer: Self-pay

## 2019-03-23 DIAGNOSIS — R262 Difficulty in walking, not elsewhere classified: Secondary | ICD-10-CM | POA: Diagnosis not present

## 2019-03-23 DIAGNOSIS — M6281 Muscle weakness (generalized): Secondary | ICD-10-CM | POA: Diagnosis not present

## 2019-03-23 DIAGNOSIS — S86091D Other specified injury of right Achilles tendon, subsequent encounter: Secondary | ICD-10-CM | POA: Diagnosis not present

## 2019-03-23 DIAGNOSIS — M25571 Pain in right ankle and joints of right foot: Secondary | ICD-10-CM | POA: Diagnosis not present

## 2019-03-23 DIAGNOSIS — R0683 Snoring: Secondary | ICD-10-CM

## 2019-03-23 LAB — HM MAMMOGRAPHY

## 2019-03-24 DIAGNOSIS — F4323 Adjustment disorder with mixed anxiety and depressed mood: Secondary | ICD-10-CM | POA: Diagnosis not present

## 2019-03-24 DIAGNOSIS — G4733 Obstructive sleep apnea (adult) (pediatric): Secondary | ICD-10-CM | POA: Diagnosis not present

## 2019-03-25 DIAGNOSIS — G4733 Obstructive sleep apnea (adult) (pediatric): Secondary | ICD-10-CM

## 2019-03-30 DIAGNOSIS — F4323 Adjustment disorder with mixed anxiety and depressed mood: Secondary | ICD-10-CM | POA: Diagnosis not present

## 2019-03-30 NOTE — Progress Notes (Signed)
Subjective: Jenna Pierce is a 53 y.o. is seen today in office s/p right foot gastrocnemius recession, posterior heel spur resection as well as Achilles tenolysis preformed on 11/15/2018.  Since last appointment she states that she is doing much better.  The steroid was helpful.  She is doing physical therapy which is been helpful.  She is the pain is overall much improved.  She denies any recent injury or trauma any changes since I last saw her and she has no other concerns today. She denies any fevers, chills, nausea vomiting.  No calf pain, chest pain, shortness of breath.  Objective: General: No acute distress, AAOx3 -presents alone wearing a regular shoe DP/PT pulses palpable 2/4, CRT < 3 sec to all digits.  Protective sensation intact. Motor function intact.  RIGHT foot: Incision appears to be doing well the scar is well formed.  There is very minimal edema there is no erythema or warmth.  No significant tenderness palpation on surgical site.  The pain to the plantar aspect the heel is almost completely resolved as well.  There is no pain over the proximal calcaneus.  MMT 5/5.  Range of motion intact.  Thompson test is negative.  Overall her symptoms are improved.  She has no other areas of tenderness elicited at this time  No other open lesions or pre-ulcerative lesions.  No pain with calf compression, swelling, warmth, erythema.   Assessment and Plan:  Status post right foot surgery, improving  -Treatment options discussed including all alternatives, risks, and complications -Overall her symptoms have improved since last appointment.  Encouraged continued physical therapy, gradually increase her activity.  He will orthopedic.  Offloading pad to the posterior heel needed as well.  We will continue to monitor.  Return in about 6 weeks (around 05/03/2019).  Trula Slade DPM

## 2019-04-01 ENCOUNTER — Other Ambulatory Visit: Payer: Self-pay | Admitting: Family Medicine

## 2019-04-01 NOTE — Telephone Encounter (Signed)
Last OV 03/09/2019 Vit D checked 03/10/19 Per lab note - -Vit D is looks great. I would recommend just a daily supplement of 1000-2000 IU daily at this point.

## 2019-04-06 DIAGNOSIS — M6281 Muscle weakness (generalized): Secondary | ICD-10-CM | POA: Diagnosis not present

## 2019-04-06 DIAGNOSIS — M25571 Pain in right ankle and joints of right foot: Secondary | ICD-10-CM | POA: Diagnosis not present

## 2019-04-06 DIAGNOSIS — S86091D Other specified injury of right Achilles tendon, subsequent encounter: Secondary | ICD-10-CM | POA: Diagnosis not present

## 2019-04-06 DIAGNOSIS — R262 Difficulty in walking, not elsewhere classified: Secondary | ICD-10-CM | POA: Diagnosis not present

## 2019-04-12 DIAGNOSIS — F4323 Adjustment disorder with mixed anxiety and depressed mood: Secondary | ICD-10-CM | POA: Diagnosis not present

## 2019-04-15 ENCOUNTER — Telehealth: Payer: Self-pay

## 2019-04-15 NOTE — Telephone Encounter (Signed)
Pt had HST performed 03/24/2019 and she is calling requesting to know the results. Tonya, please advise on this for pt. Thanks!

## 2019-04-15 NOTE — Telephone Encounter (Signed)
Called and spoke with pt letting her know the results of the HST and per [place] pt should be scheduled for visit with RA to discuss treatment. Pt expressed understanding and has been scheduled for a televisit with Dr.Alva Monday 6/1 at 10:30. Nothing further needed.

## 2019-04-15 NOTE — Telephone Encounter (Signed)
HST showed severe OSA with AHI at 48.1 SpO2 69%. Patient needs e- visit next week with Dr. Elsworth Soho (he is here Monday) or App to discuss treatment. Thanks

## 2019-04-16 DIAGNOSIS — M7660 Achilles tendinitis, unspecified leg: Secondary | ICD-10-CM | POA: Diagnosis not present

## 2019-04-16 DIAGNOSIS — M7730 Calcaneal spur, unspecified foot: Secondary | ICD-10-CM | POA: Diagnosis not present

## 2019-04-18 ENCOUNTER — Encounter: Payer: Self-pay | Admitting: Physician Assistant

## 2019-04-18 ENCOUNTER — Encounter: Payer: Self-pay | Admitting: Pulmonary Disease

## 2019-04-18 ENCOUNTER — Other Ambulatory Visit: Payer: Self-pay

## 2019-04-18 ENCOUNTER — Ambulatory Visit (INDEPENDENT_AMBULATORY_CARE_PROVIDER_SITE_OTHER): Payer: BLUE CROSS/BLUE SHIELD | Admitting: Pulmonary Disease

## 2019-04-18 ENCOUNTER — Ambulatory Visit (INDEPENDENT_AMBULATORY_CARE_PROVIDER_SITE_OTHER): Payer: BLUE CROSS/BLUE SHIELD | Admitting: Physician Assistant

## 2019-04-18 VITALS — Ht 67.0 in | Wt 264.0 lb

## 2019-04-18 DIAGNOSIS — E8881 Metabolic syndrome: Secondary | ICD-10-CM

## 2019-04-18 DIAGNOSIS — G4733 Obstructive sleep apnea (adult) (pediatric): Secondary | ICD-10-CM

## 2019-04-18 MED ORDER — BUPROPION HCL ER (XL) 300 MG PO TB24: 300.0000 mg | ORAL_TABLET | Freq: Every day | ORAL | 2 refills | Status: DC

## 2019-04-18 NOTE — Progress Notes (Signed)
I connected with  Jenna Pierce on 04/18/19 by a video enabled telemedicine application and verified that I am speaking with the correct person using two identifiers.   I discussed the limitations of evaluation and management by telemedicine. The patient expressed understanding and agreed to proceed.   Initial consult was on 01/27/2019 for excessive daytime somnolence and fatigue, she underwent home sleep study 03/24/2019 which I reviewed in detail. This showed severe obstructive sleep apnea worse in the supine position even in the left position she had significant events. Central apneas were about 9/hour Bedtime was later on 1 AM and wake up time was around 8 AM There were significant desaturations  We discussed treatment options for this level of severity.  We discussed that CPAP titration would involve COVID testing She also reported claustrophobia   Significant tests/ events reviewed  HST 03/2019 >> AHI 48/hour with few central apneas 9/hour

## 2019-04-18 NOTE — Assessment & Plan Note (Signed)
We discussed treatment options. Ideally given a central apneas, we would undertake CPAP titration.  She does not seem to have any obvious etiology for central apneas. Given the COVID crisis and the fact that CPAP titration would require cover testing, we will undertake empiric trial of CPAP therapy. Since she is claustrophobic will try nasal pillows. Prescription will be sent for auto CPAP 5 to 15 cm with nasal pillows, and download will be obtained in 1 month and settings will be tweaked as needed  Weight loss encouraged, compliance with goal of at least 4-6 hrs every night is the expectation. Advised against medications with sedative side effects Cautioned against driving when sleepy - understanding that sleepiness will vary on a day to day basis

## 2019-04-18 NOTE — Patient Instructions (Signed)
Start on auto CPAP 5 to 15 cm, nasal pillows, download in 1 month

## 2019-04-18 NOTE — Progress Notes (Signed)
Virtual Visit via Video   I connected with [place] on 04/18/19 at  1:00 PM EDT by a video enabled telemedicine application and verified that I am speaking with the correct person using two identifiers. Location patient: Home Location provider: [facility], Office Persons participating in the virtual visit: [place], [place] PA-C, [place], [place] discussed the limitations of evaluation and management by telemedicine and the availability of in person appointments. The patient expressed understanding and agreed to proceed.  I,Brandy S Coleman,acting as a Education administrator for [employer], PA.,have documented all relevant documentation on the behalf of Inda Coke, PA,as directed by  Inda Coke, PA while in the presence of Inda Coke, [place].   Subjective:   HPI:   Morbid Obesity/Insulin Resistance Was given sample of Victoza, did not start d/t fear of side effects. Would like to discuss increasing Wellbutrin. She is currently taking Wellbutrin 150 mg once daily and tolerating well at this time. Denies any concerning side effects. We did check a fasting insulin level on her and it was 35.  OSA Has sleep study done with Dr. Elsworth Soho, showed severe sleep apnea. Will start on CPAP when it arrives in about 1 week.   ROS: See pertinent positives and negatives per HPI.  Patient Active Problem List   Diagnosis Date Noted  . OSA (obstructive sleep apnea) 04/18/2019  . Insulin resistance 04/18/2019  . Subclinical hypothyroidism 01/13/2019  . Sleep disorder breathing 01/13/2019  . B12 deficiency 01/13/2019  . Situational anxiety 02/23/2018  . Morbid obesity ([place]) 02/23/2018  . Achilles tendinitis of both lower extremities 02/23/2018    Social History   Tobacco Use  . Smoking status: Never Smoker  . Smokeless tobacco: Never Used  Substance Use Topics  . Alcohol use: Yes    Comment: 3 drinks a week wine or beer    Current Outpatient Medications:  .   Cholecalciferol 1.25 MG (50000 UT) TABS, 50,000 units PO qwk for 12 weeks., Disp: 12 tablet, Rfl: 0 .  buPROPion (WELLBUTRIN XL) 300 MG 24 hr tablet, Take 1 tablet (300 mg total) by mouth daily for 30 days., Disp: 30 tablet, Rfl: 2  No Known Allergies  Objective:   VITALS: Per patient if applicable, see vitals. GENERAL: Alert, appears well and in no acute distress. HEENT: Atraumatic, conjunctiva clear, no obvious abnormalities on inspection of external nose and ears. NECK: Normal movements of the head and neck. CARDIOPULMONARY: No increased WOB. Speaking in clear sentences. I:E ratio WNL.  MS: Moves all visible extremities without noticeable abnormality. PSYCH: Pleasant and cooperative, well-groomed. Speech normal rate and rhythm. Affect is appropriate. Insight and judgement are appropriate. Attention is focused, linear, and appropriate.  NEURO: CN grossly intact. Oriented as arrived to appointment on time with no prompting. Moves both UE equally.  SKIN: No obvious lesions, wounds, erythema, or cyanosis noted on face or hands.  Assessment and Plan:   [place] was seen today for vitamin b12 deficiency, obesity and sleep apnea.  Diagnoses and all orders for this visit:  OSA (obstructive sleep apnea) Uncontrolled. Management per pulmonology. Encouraged compliance with CPAP.  Insulin resistance; Morbid obesity ([place]) Uncontrolled. She declines Victoza at this time. Will trial increased dosage of Wellbutrin to see if this helps with cravings per her request. Follow-up in 6 months, sooner if concerns.  Other orders -     buPROPion (WELLBUTRIN XL) 300 MG 24 hr tablet; Take 1 tablet (300 mg total) by mouth daily for 30  days.    . Reviewed expectations re: course of current medical issues. . Discussed self-management of symptoms. . Outlined signs and symptoms indicating need for more acute intervention. . Patient verbalized understanding and all questions were answered. Marland Kitchen Health Maintenance  issues including appropriate healthy diet, exercise, and smoking avoidance were discussed with patient. . See orders for this visit as documented in the electronic medical record.  I discussed the assessment and treatment plan with the patient. The patient was provided an opportunity to ask questions and all were answered. The patient agreed with the plan and demonstrated an understanding of the instructions.   The patient was advised to call back or seek an in-person evaluation if the symptoms worsen or if the condition fails to improve as anticipated.   CMA or LPN served as scribe during this visit. History, Physical, and Plan performed by medical provider. The above documentation has been reviewed and is accurate and complete.  [place], [place] 04/18/2019

## 2019-04-19 ENCOUNTER — Ambulatory Visit: Payer: BLUE CROSS/BLUE SHIELD | Admitting: Physician Assistant

## 2019-04-27 DIAGNOSIS — G4733 Obstructive sleep apnea (adult) (pediatric): Secondary | ICD-10-CM | POA: Diagnosis not present

## 2019-04-27 DIAGNOSIS — F4323 Adjustment disorder with mixed anxiety and depressed mood: Secondary | ICD-10-CM | POA: Diagnosis not present

## 2019-05-03 ENCOUNTER — Ambulatory Visit: Payer: BLUE CROSS/BLUE SHIELD | Admitting: Podiatry

## 2019-05-13 ENCOUNTER — Other Ambulatory Visit: Payer: Self-pay | Admitting: Physician Assistant

## 2019-05-16 DIAGNOSIS — F4323 Adjustment disorder with mixed anxiety and depressed mood: Secondary | ICD-10-CM | POA: Diagnosis not present

## 2019-05-17 DIAGNOSIS — M7660 Achilles tendinitis, unspecified leg: Secondary | ICD-10-CM | POA: Diagnosis not present

## 2019-05-17 DIAGNOSIS — M7730 Calcaneal spur, unspecified foot: Secondary | ICD-10-CM | POA: Diagnosis not present

## 2019-05-27 DIAGNOSIS — G4733 Obstructive sleep apnea (adult) (pediatric): Secondary | ICD-10-CM | POA: Diagnosis not present

## 2019-05-31 ENCOUNTER — Telehealth: Payer: Self-pay | Admitting: Pulmonary Disease

## 2019-05-31 ENCOUNTER — Ambulatory Visit (INDEPENDENT_AMBULATORY_CARE_PROVIDER_SITE_OTHER): Payer: BC Managed Care – PPO | Admitting: Adult Health

## 2019-05-31 ENCOUNTER — Other Ambulatory Visit: Payer: Self-pay

## 2019-05-31 DIAGNOSIS — G4733 Obstructive sleep apnea (adult) (pediatric): Secondary | ICD-10-CM | POA: Diagnosis not present

## 2019-05-31 NOTE — Assessment & Plan Note (Signed)
Wt loss  

## 2019-05-31 NOTE — Assessment & Plan Note (Signed)
Excellent control and compliance  Plan  Patient Instructions  Continue on CPAP At bedtime   Keep up good work .  Sample of Dream Wear Nasal Mask. Will place out front at our office.  Work on healthy weight  Do not drive if sleepy  Follow up with Dr. Elsworth Soho  Or [place] NP in 6 months and As needed

## 2019-05-31 NOTE — Telephone Encounter (Signed)
Called and spoke with pt to see if she wanted to change the visit from a televisit to [employer] video visit and pt stated for this visit she would just keep it as is. Nothing further needed.

## 2019-05-31 NOTE — Patient Instructions (Addendum)
Continue on CPAP At bedtime   Keep up good work .  Sample of Dream Wear Nasal Mask. Will place out front at our office.  Work on healthy weight  Do not drive if sleepy  Follow up with Dr. Elsworth Soho  Or Devante Capano NP in 6 months and As needed

## 2019-05-31 NOTE — Progress Notes (Signed)
@Patient  ID: Jenna Pierce, female    DOB: [birth year], 53 y.o.   MRN: [id number]  Chief Complaint  Patient presents with  . Follow-up    OSA     Referring provider: Inda Coke, PA  HPI: 53 year old female followed for obstructive sleep apnea.  TEST/EVENTS :  HST 03/2019 >> AHI 48/hour with few central apneas 9/hour  05/31/2019 Follow up : OSA  Patient returns for a follow-up visit for sleep apnea.  Patient is recently been diagnosed with sleep apnea.  She had a home sleep study in May that showed severe sleep apnea with an AHI at 48/hour.  Patient was started on nocturnal CPAP.  She says that she is doing well on CPAP.  She feels that if she is benefiting and with does have less daytime sleepiness.  She is trying to get used to being on CPAP at nighttime. CPAP download shows excellent compliance with daily average usage at 6 hours.  Patient is on auto CPAP 5 to 15 cm H2O.  AHI 3. Feels that her nasal mask moves at times.  We discussed a DreamWear nasal mask..  No Known Allergies   There is no immunization history on file for this patient.  Past Medical History:  Diagnosis Date  . Ovarian cyst     Tobacco History: Social History   Tobacco Use  Smoking Status Never Smoker  Smokeless Tobacco Never Used   Counseling given: Not Answered   Outpatient Medications Prior to Visit  Medication Sig Dispense Refill  . buPROPion (WELLBUTRIN XL) 300 MG 24 hr tablet Take 1 tablet (300 mg total) by mouth daily. 90 tablet 0  . Cholecalciferol 1.25 MG (50000 UT) TABS 50,000 units PO qwk for 12 weeks. 12 tablet 0   No facility-administered medications prior to visit.      Review of Systems:   Constitutional:   No  weight loss, night sweats,  Fevers, chills, fatigue, or  lassitude.  HEENT:   No headaches,  Difficulty swallowing,  Tooth/dental problems, or  Sore throat,                No sneezing, itching, ear ache, nasal congestion, post nasal drip,   CV:  No chest  pain,  Orthopnea, PND, swelling in lower extremities, anasarca, dizziness, palpitations, syncope.   GI  No heartburn, indigestion, abdominal pain, nausea, vomiting, diarrhea, change in bowel habits, loss of appetite, bloody stools.   Resp: No shortness of breath with exertion or at rest.  No excess mucus, no productive cough,  No non-productive cough,  No coughing up of blood.  No change in color of mucus.  No wheezing.  No chest wall deformity  Skin: no rash or lesions.  GU: no dysuria, change in color of urine, no urgency or frequency.  No flank pain, no hematuria   MS:  No joint pain or swelling.  No decreased range of motion.  No back pain.    Physical Exam  GEN: A/Ox3; pleasant , NAD, obese    HEENT:  [place]/AT,  EACs-clear, TMs-wnl, NOSE-clear, THROAT-clear, no lesions, no postnasal drip or exudate noted. Class 2-3 MP airway   NECK:  Supple w/ fair ROM; no JVD; normal carotid impulses w/o bruits; no thyromegaly or nodules palpated; no lymphadenopathy.    RESP  Clear  P & A; w/o, wheezes/ rales/ or rhonchi. no accessory muscle use, no dullness to percussion  CARD:  RRR, no m/r/g, no peripheral edema, pulses intact, no cyanosis or clubbing.  GI:   Soft & nt; nml bowel sounds; no organomegaly or masses detected.   Musco: Warm bil, no deformities or joint swelling noted.   Neuro: alert, no focal deficits noted.    Skin: Warm, no lesions or rashes    Lab Results:   BNP No results found for: BNP  ProBNP No results found for: PROBNP  Imaging: No results found.    No flowsheet data found.  No results found for: NITRICOXIDE      Assessment & Plan:   OSA (obstructive sleep apnea) Excellent control and compliance  Plan  Patient Instructions  Continue on CPAP At bedtime   Keep up good work .  Sample of Dream Wear Nasal Mask. Will place out front at our office.  Work on healthy weight  Do not drive if sleepy  Follow up with Dr. Elsworth Soho  Or Greidys Deland NP in 6  months and As needed         Morbid obesity ([place]) Wt loss      Rexene Edison, NP 05/31/2019

## 2019-06-02 DIAGNOSIS — F4323 Adjustment disorder with mixed anxiety and depressed mood: Secondary | ICD-10-CM | POA: Diagnosis not present

## 2019-06-16 DIAGNOSIS — M7730 Calcaneal spur, unspecified foot: Secondary | ICD-10-CM | POA: Diagnosis not present

## 2019-06-16 DIAGNOSIS — M7660 Achilles tendinitis, unspecified leg: Secondary | ICD-10-CM | POA: Diagnosis not present

## 2019-06-27 DIAGNOSIS — G4733 Obstructive sleep apnea (adult) (pediatric): Secondary | ICD-10-CM | POA: Diagnosis not present

## 2019-07-17 DIAGNOSIS — M7730 Calcaneal spur, unspecified foot: Secondary | ICD-10-CM | POA: Diagnosis not present

## 2019-07-17 DIAGNOSIS — M7660 Achilles tendinitis, unspecified leg: Secondary | ICD-10-CM | POA: Diagnosis not present

## 2019-07-28 DIAGNOSIS — G4733 Obstructive sleep apnea (adult) (pediatric): Secondary | ICD-10-CM | POA: Diagnosis not present

## 2019-08-10 ENCOUNTER — Other Ambulatory Visit: Payer: Self-pay | Admitting: *Deleted

## 2019-08-10 MED ORDER — BUPROPION HCL ER (XL) 300 MG PO TB24: 300.0000 mg | ORAL_TABLET | Freq: Every day | ORAL | 0 refills | Status: DC

## 2019-08-17 DIAGNOSIS — M7660 Achilles tendinitis, unspecified leg: Secondary | ICD-10-CM | POA: Diagnosis not present

## 2019-08-17 DIAGNOSIS — N3946 Mixed incontinence: Secondary | ICD-10-CM | POA: Diagnosis not present

## 2019-08-17 DIAGNOSIS — N3281 Overactive bladder: Secondary | ICD-10-CM | POA: Diagnosis not present

## 2019-08-17 DIAGNOSIS — M7730 Calcaneal spur, unspecified foot: Secondary | ICD-10-CM | POA: Diagnosis not present

## 2019-08-27 DIAGNOSIS — G4733 Obstructive sleep apnea (adult) (pediatric): Secondary | ICD-10-CM | POA: Diagnosis not present

## 2019-09-13 DIAGNOSIS — N3281 Overactive bladder: Secondary | ICD-10-CM | POA: Diagnosis not present

## 2019-09-13 DIAGNOSIS — N3946 Mixed incontinence: Secondary | ICD-10-CM | POA: Diagnosis not present

## 2019-09-20 ENCOUNTER — Other Ambulatory Visit: Payer: Self-pay | Admitting: Podiatry

## 2019-09-20 ENCOUNTER — Ambulatory Visit (INDEPENDENT_AMBULATORY_CARE_PROVIDER_SITE_OTHER): Payer: BC Managed Care – PPO

## 2019-09-20 ENCOUNTER — Ambulatory Visit (INDEPENDENT_AMBULATORY_CARE_PROVIDER_SITE_OTHER): Payer: BC Managed Care – PPO | Admitting: Podiatry

## 2019-09-20 ENCOUNTER — Other Ambulatory Visit: Payer: Self-pay

## 2019-09-20 DIAGNOSIS — M7732 Calcaneal spur, left foot: Secondary | ICD-10-CM | POA: Diagnosis not present

## 2019-09-20 DIAGNOSIS — M216X2 Other acquired deformities of left foot: Secondary | ICD-10-CM

## 2019-09-20 DIAGNOSIS — M7661 Achilles tendinitis, right leg: Secondary | ICD-10-CM

## 2019-09-20 DIAGNOSIS — M7662 Achilles tendinitis, left leg: Secondary | ICD-10-CM | POA: Diagnosis not present

## 2019-09-20 DIAGNOSIS — M779 Enthesopathy, unspecified: Secondary | ICD-10-CM

## 2019-09-20 DIAGNOSIS — M79671 Pain in right foot: Secondary | ICD-10-CM

## 2019-09-20 DIAGNOSIS — M79672 Pain in left foot: Secondary | ICD-10-CM

## 2019-09-20 NOTE — Patient Instructions (Signed)
Pre-Operative Instructions  Congratulations, you have decided to take an important step towards improving your quality of life.  You can be assured that the doctors and staff at [facility] will be with you every step of the way.  Here are some important things you should know:  1. Plan to be at the surgery center/hospital at least 1 (one) hour prior to your scheduled time, unless otherwise directed by the surgical center/hospital staff.  You must have a responsible adult accompany you, remain during the surgery and drive you home.  Make sure you have directions to the surgical center/hospital to ensure you arrive on time. 2. If you are having surgery at [facility] or [facility] hospitals, you will need a copy of your medical history and physical form from your family physician within one month prior to the date of surgery. We will give you a form for your primary physician to complete.  3. We make every effort to accommodate the date you request for surgery.  However, there are times where surgery dates or times have to be moved.  We will contact you as soon as possible if a change in schedule is required.   4. No aspirin/ibuprofen for one week before surgery.  If you are on aspirin, any non-steroidal anti-inflammatory medications (Mobic, Aleve, Ibuprofen) should not be taken seven (7) days prior to your surgery.  You make take Tylenol for pain prior to surgery.  5. Medications - If you are taking daily heart and blood pressure medications, seizure, reflux, allergy, asthma, anxiety, pain or diabetes medications, make sure you notify the surgery center/hospital before the day of surgery so they can tell you which medications you should take or avoid the day of surgery. 6. No food or drink after midnight the night before surgery unless directed otherwise by surgical center/hospital staff. 7. No alcoholic beverages 24-hours prior to surgery.  No smoking 24-hours prior or 24-hours after  surgery. 8. Wear loose pants or shorts. They should be loose enough to fit over bandages, boots, and casts. 9. Don't wear slip-on shoes. Sneakers are preferred. 10. Bring your boot with you to the surgery center/hospital.  Also bring crutches or a walker if your physician has prescribed it for you.  If you do not have this equipment, it will be provided for you after surgery. 11. If you have not been contacted by the surgery center/hospital by the day before your surgery, call to confirm the date and time of your surgery. 12. Leave-time from work may vary depending on the type of surgery you have.  Appropriate arrangements should be made prior to surgery with your employer. 13. Prescriptions will be provided immediately following surgery by your doctor.  Fill these as soon as possible after surgery and take the medication as directed. Pain medications will not be refilled on weekends and must be approved by the doctor. 14. Remove nail polish on the operative foot and avoid getting pedicures prior to surgery. 15. Wash the night before surgery.  The night before surgery wash the foot and leg well with water and the antibacterial soap provided. Be sure to pay special attention to beneath the toenails and in between the toes.  Wash for at least three (3) minutes. Rinse thoroughly with water and dry well with a towel.  Perform this wash unless told not to do so by your physician.  Enclosed: 1 Ice pack (please put in freezer the night before surgery)   1 Hibiclens skin cleaner     Pre-op instructions  If you have any questions regarding the instructions, please do not hesitate to call our office.  [place]: [address], [place], [place] [address] -- [phone]  [place]: [address] [place], [place] [address] -- [phone]  [place]: [address]  [place], [place] [address] -- [phone]   Website: [email or web address] 

## 2019-09-21 DIAGNOSIS — M7551 Bursitis of right shoulder: Secondary | ICD-10-CM | POA: Diagnosis not present

## 2019-09-21 DIAGNOSIS — M25511 Pain in right shoulder: Secondary | ICD-10-CM | POA: Diagnosis not present

## 2019-09-22 DIAGNOSIS — Z01419 Encounter for gynecological examination (general) (routine) without abnormal findings: Secondary | ICD-10-CM | POA: Diagnosis not present

## 2019-09-22 DIAGNOSIS — Z6835 Body mass index (BMI) 35.0-35.9, adult: Secondary | ICD-10-CM | POA: Diagnosis not present

## 2019-09-23 DIAGNOSIS — Z130 Encounter for screening for diseases of the blood and blood-forming organs and certain disorders involving the immune mechanism: Secondary | ICD-10-CM | POA: Diagnosis not present

## 2019-09-23 DIAGNOSIS — Z1321 Encounter for screening for nutritional disorder: Secondary | ICD-10-CM | POA: Diagnosis not present

## 2019-09-23 DIAGNOSIS — Z13228 Encounter for screening for other metabolic disorders: Secondary | ICD-10-CM | POA: Diagnosis not present

## 2019-09-23 DIAGNOSIS — Z13220 Encounter for screening for lipoid disorders: Secondary | ICD-10-CM | POA: Diagnosis not present

## 2019-09-23 DIAGNOSIS — Z1329 Encounter for screening for other suspected endocrine disorder: Secondary | ICD-10-CM | POA: Diagnosis not present

## 2019-09-23 LAB — BASIC METABOLIC PANEL
BUN: 20 (ref 4–21)
CO2: 28 — AB (ref 13–22)
Chloride: 105 (ref 99–108)
Creatinine: 0.9 (ref 0.5–1.1)
Glucose: 86
Potassium: 4.7 (ref 3.4–5.3)
Sodium: 144 (ref 137–147)

## 2019-09-23 LAB — HEPATIC FUNCTION PANEL
ALT: 14 (ref 7–35)
AST: 12 — AB (ref 13–35)
Alkaline Phosphatase: 140 — AB (ref 25–125)
Bilirubin, Total: 0.3

## 2019-09-23 LAB — HM PAP SMEAR

## 2019-09-23 LAB — LIPID PANEL
Cholesterol: 198 (ref 0–200)
HDL: 73 — AB (ref 35–70)
LDL Cholesterol: 112
LDl/HDL Ratio: 1.6
Triglycerides: 71 (ref 40–160)

## 2019-09-23 LAB — VITAMIN D 25 HYDROXY (VIT D DEFICIENCY, FRACTURES): Vit D, 25-Hydroxy: 35.9

## 2019-09-23 LAB — COMPREHENSIVE METABOLIC PANEL
Albumin: 4.8 (ref 3.5–5.0)
Calcium: 10.5 (ref 8.7–10.7)
GFR calc non Af Amer: 76

## 2019-09-23 LAB — CBC AND DIFFERENTIAL
HCT: 42 (ref 36–46)
Hemoglobin: 14.2 (ref 12.0–16.0)
Neutrophils Absolute: 5
Platelets: 230 (ref 150–399)
WBC: 7.6

## 2019-09-23 LAB — CBC: RBC: 4.65 (ref 3.87–5.11)

## 2019-09-23 LAB — TSH: TSH: 4.06 (ref 0.41–5.90)

## 2019-09-26 DIAGNOSIS — M62838 Other muscle spasm: Secondary | ICD-10-CM | POA: Diagnosis not present

## 2019-09-26 DIAGNOSIS — M6281 Muscle weakness (generalized): Secondary | ICD-10-CM | POA: Diagnosis not present

## 2019-09-26 DIAGNOSIS — M6289 Other specified disorders of muscle: Secondary | ICD-10-CM | POA: Diagnosis not present

## 2019-09-26 NOTE — Progress Notes (Signed)
Subjective: 53 year old female presents the office today for surgical consultation given painful spur to the posterior aspect left heel.  She states that at this point she wants to go ahead and have the surgery however she was not sure the right side is doing well before going on to the other side.  She states that the right foot has been doing much better recently.  She states that the recovery was longer than she expected but otherwise she is doing well on the right side.  She is back to wearing regular shoes without any issues.  She states that the left foot already feels better and she is walking more normally on the right side and she believes that she has a left side down she will feel much better. Denies any systemic complaints such as fevers, chills, nausea, vomiting. No acute changes since last appointment, and no other complaints at this time.   Objective: AAO x3, NAD DP/PT pulses palpable bilaterally, CRT less than 3 seconds Incision of the posterior aspect of right side is intact with a scar.  There is no significant edema, erythema.  There is no tenderness palpation.  Thompson test is negative medially toe appears to be intact.  Large spur on the posterior aspect the left heel with tenderness to palpation on the distal portion Achilles tendon along insertion into the calcaneus.  Equinus is present. No pain with calf compression, swelling, warmth, erythema  Assessment: Left posterior heel pain, heel spur with equinus  Plan: -All treatment options discussed with the patient including all alternatives, risks, complications.  -X-rays obtained and reviewed.  Spurring present of the posterior aspect left heel.  No evidence of acute fracture.  Status post heel spur resection of the right foot. -The right foot seems to be doing well and she is happy with the results.  At this time she wants to go and proceed with surgery in the left foot. -Discussed left foot here for resection, Achilles  tenolysis and gastroc recession -The incision placement as well as the postoperative course was discussed with the patient. I discussed risks of the surgery which include, but not limited to, infection, bleeding, pain, swelling, need for further surgery, delayed or nonhealing, painful or ugly scar, numbness or sensation changes, over/under correction, recurrence, transfer lesions, further deformity, hardware failure, DVT/PE, loss of toe/foot. Patient understands these risks and wishes to proceed with surgery. The surgical consent was reviewed with the patient all 3 pages were signed. No promises or guarantees were given to the outcome of the procedure. All questions were answered to the best of my ability. Before the surgery the patient was encouraged to call the office if there is any further questions. The surgery will be performed at the [facility] on an outpatient basis.  Trula Slade DPM

## 2019-09-27 DIAGNOSIS — G4733 Obstructive sleep apnea (adult) (pediatric): Secondary | ICD-10-CM | POA: Diagnosis not present

## 2019-10-03 DIAGNOSIS — M62838 Other muscle spasm: Secondary | ICD-10-CM | POA: Diagnosis not present

## 2019-10-03 DIAGNOSIS — M6289 Other specified disorders of muscle: Secondary | ICD-10-CM | POA: Diagnosis not present

## 2019-10-03 DIAGNOSIS — M6281 Muscle weakness (generalized): Secondary | ICD-10-CM | POA: Diagnosis not present

## 2019-10-03 DIAGNOSIS — N393 Stress incontinence (female) (male): Secondary | ICD-10-CM | POA: Diagnosis not present

## 2019-10-18 DIAGNOSIS — N952 Postmenopausal atrophic vaginitis: Secondary | ICD-10-CM | POA: Diagnosis not present

## 2019-10-18 DIAGNOSIS — N3946 Mixed incontinence: Secondary | ICD-10-CM | POA: Diagnosis not present

## 2019-10-24 DIAGNOSIS — M6289 Other specified disorders of muscle: Secondary | ICD-10-CM | POA: Diagnosis not present

## 2019-10-24 DIAGNOSIS — M62838 Other muscle spasm: Secondary | ICD-10-CM | POA: Diagnosis not present

## 2019-10-24 DIAGNOSIS — R3914 Feeling of incomplete bladder emptying: Secondary | ICD-10-CM | POA: Diagnosis not present

## 2019-10-24 DIAGNOSIS — M6281 Muscle weakness (generalized): Secondary | ICD-10-CM | POA: Diagnosis not present

## 2019-10-31 DIAGNOSIS — M6281 Muscle weakness (generalized): Secondary | ICD-10-CM | POA: Diagnosis not present

## 2019-10-31 DIAGNOSIS — N393 Stress incontinence (female) (male): Secondary | ICD-10-CM | POA: Diagnosis not present

## 2019-10-31 DIAGNOSIS — M62838 Other muscle spasm: Secondary | ICD-10-CM | POA: Diagnosis not present

## 2019-10-31 DIAGNOSIS — M6289 Other specified disorders of muscle: Secondary | ICD-10-CM | POA: Diagnosis not present

## 2019-11-04 ENCOUNTER — Telehealth: Payer: Self-pay | Admitting: *Deleted

## 2019-11-04 NOTE — Telephone Encounter (Addendum)
DOS 11/16/2019 TENOLYSIS - [id number], GASTROCNEMIUS RECESS - [id number], AND CALCANEAL OSTECTOMY  - [id number]  BCBS: Eligibility Date - 11/17/2016 - 11/17/2019   In-Network    Max Per Benefit Period Year-to-Date Remaining  CoInsurance     Deductible  $4,000.00 $0.00  Out-Of-Pocket 3  $4,000.00 $54.56   In-Network  [employer] Coinsurance Authorization Required  Not Applicable  [id number]  No  Messages: SURGERY - INSTITUTIONAL

## 2019-11-05 ENCOUNTER — Other Ambulatory Visit: Payer: Self-pay | Admitting: Physician Assistant

## 2019-11-07 ENCOUNTER — Telehealth: Payer: Self-pay

## 2019-11-07 NOTE — Telephone Encounter (Signed)
Call patient for CPE sent in 30 days of meds but will need to be seen before more given.

## 2019-11-07 NOTE — Telephone Encounter (Signed)
Left a voicemail for the patient to contact the office to schedule an appointment and advised that a 30 day refill was sent in.

## 2019-11-10 DIAGNOSIS — Z20828 Contact with and (suspected) exposure to other viral communicable diseases: Secondary | ICD-10-CM | POA: Diagnosis not present

## 2019-11-10 DIAGNOSIS — Z03818 Encounter for observation for suspected exposure to other biological agents ruled out: Secondary | ICD-10-CM | POA: Diagnosis not present

## 2019-11-15 ENCOUNTER — Other Ambulatory Visit: Payer: Self-pay | Admitting: Podiatry

## 2019-11-15 MED ORDER — IBUPROFEN 800 MG PO TABS: 800.0000 mg | ORAL_TABLET | Freq: Three times a day (TID) | ORAL | 0 refills | Status: DC | PRN

## 2019-11-15 MED ORDER — PROMETHAZINE HCL 25 MG PO TABS: 25.0000 mg | ORAL_TABLET | Freq: Three times a day (TID) | ORAL | 0 refills | Status: DC | PRN

## 2019-11-15 MED ORDER — CEPHALEXIN 500 MG PO CAPS: 500.0000 mg | ORAL_CAPSULE | Freq: Three times a day (TID) | ORAL | 0 refills | Status: DC

## 2019-11-15 MED ORDER — OXYCODONE-ACETAMINOPHEN 5-325 MG PO TABS: 1.0000 | ORAL_TABLET | Freq: Four times a day (QID) | ORAL | 0 refills | Status: DC | PRN

## 2019-11-15 NOTE — Progress Notes (Signed)
I called the patient to go over the surgery tomorrow and see if she had any last minute questions. Reviewed the postop course. No further questions. Sent postop medications to verified pharmacy.

## 2019-11-16 ENCOUNTER — Encounter: Payer: Self-pay | Admitting: Podiatry

## 2019-11-16 DIAGNOSIS — Z01818 Encounter for other preprocedural examination: Secondary | ICD-10-CM | POA: Diagnosis not present

## 2019-11-16 DIAGNOSIS — M7732 Calcaneal spur, left foot: Secondary | ICD-10-CM | POA: Diagnosis not present

## 2019-11-16 DIAGNOSIS — M25572 Pain in left ankle and joints of left foot: Secondary | ICD-10-CM | POA: Diagnosis not present

## 2019-11-16 DIAGNOSIS — M7731 Calcaneal spur, right foot: Secondary | ICD-10-CM | POA: Diagnosis not present

## 2019-11-16 DIAGNOSIS — M7662 Achilles tendinitis, left leg: Secondary | ICD-10-CM | POA: Diagnosis not present

## 2019-11-16 DIAGNOSIS — M62462 Contracture of muscle, left lower leg: Secondary | ICD-10-CM | POA: Diagnosis not present

## 2019-11-16 DIAGNOSIS — M216X2 Other acquired deformities of left foot: Secondary | ICD-10-CM | POA: Diagnosis not present

## 2019-11-16 DIAGNOSIS — M216X1 Other acquired deformities of right foot: Secondary | ICD-10-CM | POA: Diagnosis not present

## 2019-11-17 ENCOUNTER — Telehealth: Payer: Self-pay | Admitting: *Deleted

## 2019-11-17 NOTE — Telephone Encounter (Signed)
Left a message for the patient to call me back-I was calling to check on the patient after surgery on Wednesday with Dr Jacqualyn Posey and I stated if any concerns or questions to call the on call doctor or can go thru my chart and message Dr Jacqualyn Posey and that we would see patient next week for post op visit. [name]

## 2019-11-21 ENCOUNTER — Other Ambulatory Visit: Payer: Self-pay

## 2019-11-21 ENCOUNTER — Ambulatory Visit (INDEPENDENT_AMBULATORY_CARE_PROVIDER_SITE_OTHER): Payer: BC Managed Care – PPO

## 2019-11-21 ENCOUNTER — Ambulatory Visit (INDEPENDENT_AMBULATORY_CARE_PROVIDER_SITE_OTHER): Payer: BC Managed Care – PPO | Admitting: Podiatry

## 2019-11-21 DIAGNOSIS — M216X2 Other acquired deformities of left foot: Secondary | ICD-10-CM | POA: Diagnosis not present

## 2019-11-21 DIAGNOSIS — M7732 Calcaneal spur, left foot: Secondary | ICD-10-CM

## 2019-11-22 NOTE — Progress Notes (Signed)
Subjective: Jenna Pierce is a 54 y.o. is seen today in office s/p left posterior calcaneal spur resection with Achilles tenolysis and gastrocnemius recession preformed on 11/16/2019.  She has been nonweightbearing.  Her pain is currently controlled.  Denies any recent injury or falls. Denies any systemic complaints such as fevers, chills, nausea, vomiting. No calf pain, chest pain, shortness of breath.   Objective: General: No acute distress, AAOx3  DP/PT pulses palpable 2/4, CRT < 3 sec to all digits.  Protective sensation intact. Motor function intact.  LEFT foot: Incision is well coapted without any evidence of dehiscence with sutures, staples intact. There is no surrounding erythema, ascending cellulitis, fluctuance, crepitus, malodor, drainage/purulence. There is mild edema around the surgical site mostly on the central aspect of the heel spur resection incision. There is mild pain along the surgical site.  No other areas of tenderness to bilateral lower extremities.  No other open lesions or pre-ulcerative lesions.  No pain with calf compression, swelling, warmth, erythema.   Assessment and Plan:  Status post left foot surgery, doing well with no complications   -Treatment options discussed including all alternatives, risks, and complications -X-rays obtained reviewed.  Status post calcaneal spur resection.  No evidence of acute fracture or foreign body. -Antibiotic ointment and a dressing applied.  A well-padded below-knee fiberglass cast was applied making sure to pad all bony prominences. -NWB -Ice/elevation -Pain medication as needed. -Monitor for any clinical signs or symptoms of infection and DVT/PE and directed to call the office immediately should any occur or go to the ER. -Follow-up as scheduled for cast change and possible suture removal or sooner if any problems arise. In the meantime, encouraged to call the office with any questions, concerns, change in symptoms.    Celesta Gentile, DPM

## 2019-11-25 ENCOUNTER — Other Ambulatory Visit: Payer: Self-pay

## 2019-11-28 ENCOUNTER — Other Ambulatory Visit: Payer: Self-pay

## 2019-11-28 ENCOUNTER — Encounter: Payer: Self-pay | Admitting: Physician Assistant

## 2019-11-28 ENCOUNTER — Ambulatory Visit (INDEPENDENT_AMBULATORY_CARE_PROVIDER_SITE_OTHER): Payer: BC Managed Care – PPO | Admitting: Physician Assistant

## 2019-11-28 VITALS — BP 130/82 | HR 77 | Temp 98.0°F | Ht 67.0 in | Wt 224.0 lb

## 2019-11-28 DIAGNOSIS — F320 Major depressive disorder, single episode, mild: Secondary | ICD-10-CM

## 2019-11-28 DIAGNOSIS — F418 Other specified anxiety disorders: Secondary | ICD-10-CM | POA: Diagnosis not present

## 2019-11-28 DIAGNOSIS — T887XXA Unspecified adverse effect of drug or medicament, initial encounter: Secondary | ICD-10-CM | POA: Diagnosis not present

## 2019-11-28 DIAGNOSIS — G4733 Obstructive sleep apnea (adult) (pediatric): Secondary | ICD-10-CM | POA: Diagnosis not present

## 2019-11-28 MED ORDER — BUPROPION HCL ER (XL) 300 MG PO TB24: 300.0000 mg | ORAL_TABLET | Freq: Every day | ORAL | 1 refills | Status: DC

## 2019-11-28 NOTE — Patient Instructions (Signed)
It was great to see you!  Continue Wellbutrin. I'll keep you posted if I come across anything about dementia and estrogen.  Let's follow-up in 6 months, sooner if you have concerns.  Take care,  Inda Coke PA-C

## 2019-11-28 NOTE — Progress Notes (Deleted)
I acted as a Education administrator for [employer], PA-C Anselmo Pickler, LPN   Subjective:    Jenna Pierce is a 54 y.o. female and is here for a comprehensive physical exam.   HPI  Health Maintenance Due  Topic Date Due  . TETANUS/TDAP  10/29/1987  . MAMMOGRAM  12/10/2017  . COLONOSCOPY  10/28/2018    Acute Concerns:   Chronic Issues:   Health Maintenance: Immunizations -- *** Colonoscopy -- *** Mammogram -- *** PAP -- *** Bone Density -- *** Diet -- *** Caffeine intake -- *** Sleep habits -- *** Exercise -- *** Weight --    Mood -- *** Weight history: Wt Readings from Last 10 Encounters:  04/18/19 264 lb (119.7 kg)  01/27/19 264 lb (119.7 kg)  01/03/19 264 lb 9.6 oz (120 kg)  02/23/18 258 lb 3.2 oz (117.1 kg)  02/02/18 256 lb 6.4 oz (116.3 kg)  08/21/17 254 lb 8 oz (115.4 kg)  07/02/16 225 lb (102.1 kg)  09/17/15 233 lb 3.2 oz (105.8 kg)  08/20/15 238 lb (108 kg)  08/10/15 238 lb 8 oz (108.2 kg)   Patient's last menstrual period was 08/19/2015 (exact date). Period characteristics: Alcohol use: Tobacco use:  Depression screen [facility] 2/9 01/03/2019  Decreased Interest 2  Down, Depressed, Hopeless 3  PHQ - 2 Score 5  Altered sleeping 3  Tired, decreased energy 3  Change in appetite 2  Feeling bad or failure about yourself  3  Trouble concentrating 3  Moving slowly or fidgety/restless 0  Suicidal thoughts 0  PHQ-9 Score 19  Difficult doing work/chores Very difficult     Other providers/specialists: Patient Care Team: Inda Coke, [place] as PCP - General (Physician Assistant)    PMHx, SurgHx, SocialHx, Medications, and Allergies were reviewed in the Visit Navigator and updated as appropriate.   Past Medical History:  Diagnosis Date  . Ovarian cyst     Past Surgical History:  Procedure Laterality Date  . ABDOMINAL HYSTERECTOMY    . BUNIONECTOMY Bilateral 2017   done 4 weeks apart  . distal radius repair Left 2016  . FRACTURE SURGERY    .  pin and repair 5th metacarpal  2017  . ROBOTIC ASSISTED TOTAL HYSTERECTOMY WITH BILATERAL SALPINGO OOPHERECTOMY  08/24/2015   Performed by Dr[facility] Nancy Marus at [facility]  . WRIST FRACTURE SURGERY Left 02/26/2015   hardware and screws placed    Family History  Problem Relation Age of Onset  . Diabetes Father   . Arthritis Father   . Hyperlipidemia Father   . Hypertension Father   . Heart attack Father   . Breast cancer Paternal Grandmother   . Arthritis Mother   . COPD Mother   . Depression Mother   . Alzheimer's disease Mother   . Alcohol abuse Maternal Grandfather     Social History   Tobacco Use  . Smoking status: Never Smoker  . Smokeless tobacco: Never Used  Substance Use Topics  . Alcohol use: Yes    Comment: 3 drinks a week wine or beer  . Drug use: No    Review of Systems:   ROS  Objective:   LMP 08/19/2015 (Exact Date)  There is no height or weight on file to calculate BMI.   General Appearance:    Alert, cooperative, no distress, appears stated age  Head:    Normocephalic, without obvious abnormality, atraumatic  Eyes:    PERRL, conjunctiva/corneas clear, EOM's intact, fundi    benign, both eyes  Ears:    Normal TM's and external ear canals, both ears  Nose:   Nares normal, septum midline, mucosa normal, no drainage    or sinus tenderness  Throat:   Lips, mucosa, and tongue normal; teeth and gums normal  Neck:   Supple, symmetrical, trachea midline, no adenopathy;    thyroid:  no enlargement/tenderness/nodules; no carotid   bruit or JVD  Back:     Symmetric, no curvature, ROM normal, no CVA tenderness  Lungs:     Clear to auscultation bilaterally, respirations unlabored  Chest Wall:    No tenderness or deformity   Heart:    Regular rate and rhythm, S1 and S2 normal, no murmur, rub or gallop  Breast Exam:    No tenderness, masses, or nipple abnormality  Abdomen:     Soft, non-tender, bowel sounds active all four quadrants,    no masses, no  organomegaly  Genitalia:    Normal female without lesion, discharge or tenderness  Extremities:   Extremities normal, atraumatic, no cyanosis or edema  Pulses:   2+ and symmetric all extremities  Skin:   Skin color, texture, turgor normal, no rashes or lesions  Lymph nodes:   Cervical, supraclavicular, and axillary nodes normal  Neurologic:   CNII-XII intact, normal strength, sensation and reflexes    throughout   Results for orders placed or performed in visit on 03/09/19  Insulin, Free (Bioactive)-([facility])  Result Value Ref Range   Insulin, Free 35.8 (H) 1.5 - 14.9 uIU/mL  Vitamin B12  Result Value Ref Range   Vitamin B-12 267 211 - 911 pg/mL  VITAMIN D 25 Hydroxy (Vit-D Deficiency, Fractures)  Result Value Ref Range   VITD 39.53 30.00 - 100.00 ng/mL  TSH  Result Value Ref Range   TSH 5.22 (H) 0.35 - 4.50 uIU/mL  T4, free  Result Value Ref Range   Free T4 0.87 0.60 - 1.60 ng/dL    Assessment/Plan:   There are no diagnoses linked to this encounter.   Well Adult Exam: Labs ordered: {Yes/No:18319::"Yes"}. Patient counseling was done. See below for items discussed. Discussed the patient's BMI.  The {BMI plan (MU NQF measure 421):19504} Follow up {follow-up interval:13814}. Breast cancer screening: ***. Cervical cancer screening: ***   Patient Counseling: [x]    Nutrition: Stressed importance of moderation in sodium/caffeine intake, saturated fat and cholesterol, caloric balance, sufficient intake of fresh fruits, vegetables, fiber, calcium, iron, and 1 mg of folate supplement per day (for females capable of pregnancy).  [x]    Stressed the importance of regular exercise.   [x]    Substance Abuse: Discussed cessation/primary prevention of tobacco, alcohol, or other drug use; driving or other dangerous activities under the influence; availability of treatment for abuse.   [x]    Injury prevention: Discussed safety belts, safety helmets, smoke detector, smoking near bedding or upholstery.    [x]    Sexuality: Discussed sexually transmitted diseases, partner selection, use of condoms, avoidance of unintended pregnancy  and contraceptive alternatives.  [x]    Dental health: Discussed importance of regular tooth brushing, flossing, and dental visits.  [x]    Health maintenance and immunizations reviewed. Please refer to Health maintenance section.   ***  Inda Coke, PA-C [place]

## 2019-11-28 NOTE — Progress Notes (Signed)
Jenna Pierce is a 54 y.o. female is here to follow up on anxiety.  I acted as a Education administrator for [employer], PA-C Anselmo Pickler, LPN  History of Present Illness:   Chief Complaint  Patient presents with  . Anxiety    HPI  Depression and Anxiety Currently on Wellbutrin 300 mg XL daily. Tolerating well, feels like this medication is working well for her. Denies: SI, HI, dizziness, lightheadedness   Depression screen [facility] 2/9 11/28/2019 01/03/2019 02/02/2018 08/21/2017  Decreased Interest 0 2 2 -  Down, Depressed, Hopeless 0 3 3 1   PHQ - 2 Score 0 5 5 1   Altered sleeping 2 3 3  -  Tired, decreased energy 0 3 3 -  Change in appetite 1 2 2  -  Feeling bad or failure about yourself  0 3 2 -  Trouble concentrating 1 3 1  -  Moving slowly or fidgety/restless 0 0 1 -  Suicidal thoughts 0 0 0 -  PHQ-9 Score 4 19 17  -  Difficult doing work/chores Not difficult at all Very difficult Somewhat difficult -   GAD 7 : Generalized Anxiety Score 11/28/2019 02/02/2018 08/21/2017  Nervous, Anxious, on Edge 1 3 3   Control/stop worrying 1 3 3   Worry too much - different things 0 3 3  Trouble relaxing 0 2 3  Restless 0 0 1  Easily annoyed or irritable 0 2 3  Afraid - awful might happen 0 0 0  Total GAD 7 Score 2 13 16   Anxiety Difficulty Not difficult at all - -    Estrogen use Has rx for estrogen 0.625 mg tablet daily. Was prescribed this and told may be preventative for dementia. Mother died with lewy body dementia. She is wondering about side effects of estrogen and if she should really take this; would like second opinion.   Health Maintenance Due  Topic Date Due  . TETANUS/TDAP  10/29/1987  . MAMMOGRAM  12/10/2017  . COLONOSCOPY  10/28/2018    Past Medical History:  Diagnosis Date  . Ovarian cyst      Social History   Socioeconomic History  . Marital status: Married    Spouse name: Not on file  . Number of children: Not on file  . Years of education: Not on file  .  Highest education level: Not on file  Occupational History  . Not on file  Tobacco Use  . Smoking status: Never Smoker  . Smokeless tobacco: Never Used  Substance and Sexual Activity  . Alcohol use: Yes    Comment: 3 drinks a week wine or beer  . Drug use: No  . Sexual activity: Yes  Other Topics Concern  . Not on file  Social History Narrative   Film/video editor for [employer]   2 children   Married   Active in her church   Social Determinants of Health   Financial Resource Strain:   . Difficulty of Paying Living Expenses: Not on file  Food Insecurity:   . Worried About Charity fundraiser in the Last Year: Not on file  . Ran Out of Food in the Last Year: Not on file  Transportation Needs:   . Lack of Transportation (Medical): Not on file  . Lack of Transportation (Non-Medical): Not on file  Physical Activity:   . Days of Exercise per Week: Not on file  . Minutes of Exercise per Session: Not on file  Stress:   . Feeling of Stress : Not  on file  Social Connections:   . Frequency of Communication with Friends and Family: Not on file  . Frequency of Social Gatherings with Friends and Family: Not on file  . Attends Religious Services: Not on file  . Active Member of Clubs or Organizations: Not on file  . Attends Archivist Meetings: Not on file  . Marital Status: Not on file  Intimate Partner Violence:   . Fear of Current or Ex-Partner: Not on file  . Emotionally Abused: Not on file  . Physically Abused: Not on file  . Sexually Abused: Not on file    Past Surgical History:  Procedure Laterality Date  . ABDOMINAL HYSTERECTOMY    . BUNIONECTOMY Bilateral 2017   done 4 weeks apart  . distal radius repair Left 2016  . FRACTURE SURGERY    . pin and repair 5th metacarpal  2017  . ROBOTIC ASSISTED TOTAL HYSTERECTOMY WITH BILATERAL SALPINGO OOPHERECTOMY  08/24/2015   Performed by Dr. Nancy Marus at [facility]  . WRIST FRACTURE SURGERY Left  02/26/2015   hardware and screws placed    Family History  Problem Relation Age of Onset  . Diabetes Father   . Arthritis Father   . Hyperlipidemia Father   . Hypertension Father   . Heart attack Father   . Breast cancer Paternal Grandmother   . Arthritis Mother   . COPD Mother   . Depression Mother   . Alzheimer's disease Mother   . Alcohol abuse Maternal Grandfather     PMHx, SurgHx, SocialHx, FamHx, Medications, and Allergies were reviewed in the Visit Navigator and updated as appropriate.   Patient Active Problem List   Diagnosis Date Noted  . Depression, major, single episode, mild ([place]) 11/28/2019  . OSA (obstructive sleep apnea) 04/18/2019  . Insulin resistance 04/18/2019  . Subclinical hypothyroidism 01/13/2019  . Sleep disorder breathing 01/13/2019  . B12 deficiency 01/13/2019  . Situational anxiety 02/23/2018  . Morbid obesity ([place]) 02/23/2018  . Achilles tendinitis of both lower extremities 02/23/2018    Social History   Tobacco Use  . Smoking status: Never Smoker  . Smokeless tobacco: Never Used  Substance Use Topics  . Alcohol use: Yes    Comment: 3 drinks a week wine or beer  . Drug use: No    Current Medications and Allergies:    Current Outpatient Medications:  .  buPROPion (WELLBUTRIN XL) 300 MG 24 hr tablet, Take 1 tablet (300 mg total) by mouth daily., Disp: 90 tablet, Rfl: 1 .  Cholecalciferol 1.25 MG (50000 UT) TABS, 50,000 units PO qwk for 12 weeks., Disp: 12 tablet, Rfl: 0 .  ibuprofen (ADVIL) 800 MG tablet, Take 1 tablet (800 mg total) by mouth every 8 (eight) hours as needed., Disp: 30 tablet, Rfl: 0 .  MYRBETRIQ 25 MG TB24 tablet, Take 25 mg by mouth at bedtime., Disp: , Rfl:  .  PREMARIN 0.625 MG tablet, Take 0.625 mg by mouth as directed., Disp: , Rfl:  .  PREMARIN vaginal cream, PLACE A PEA SIZED AMOUNT IN THE VAGINA NIGHTLY FOR 3 WEEKS, THEN USE EVERY OTHER NIGHT, Disp: , Rfl:  .  promethazine (PHENERGAN) 25 MG tablet, Take 1 tablet  (25 mg total) by mouth every 8 (eight) hours as needed for nausea or vomiting. (Patient not taking: Reported on 11/28/2019), Disp: 20 tablet, Rfl: 0  No Known Allergies  Review of Systems   ROS  Negative unless otherwise specified per HPI.  Vitals:   Vitals:  11/28/19 1051  BP: 130/82  Pulse: 77  Temp: 98 F (36.7 C)  TempSrc: Temporal  SpO2: 95%  Weight: 224 lb (101.6 kg)  Height: 5\' 7"  (1.702 m)     Body mass index is 35.08 kg/m.   Physical Exam:    Physical Exam Vitals and nursing note reviewed.  Constitutional:      General: She is not in acute distress.    Appearance: She is well-developed. She is not ill-appearing or toxic-appearing.  Cardiovascular:     Rate and Rhythm: Normal rate and regular rhythm.     Pulses: Normal pulses.     Heart sounds: Normal heart sounds, S1 normal and S2 normal.     Comments: No LE edema Pulmonary:     Effort: Pulmonary effort is normal.     Breath sounds: Normal breath sounds.  Skin:    General: Skin is warm and dry.  Neurological:     Mental Status: She is alert.     GCS: GCS eye subscore is 4. GCS verbal subscore is 5. GCS motor subscore is 6.  Psychiatric:        Speech: Speech normal.        Behavior: Behavior normal. Behavior is cooperative.      Assessment and Plan:    Shailynn was seen today for anxiety.  Diagnoses and all orders for this visit:  Situational anxiety; Depression, major, single episode, mild ([place]) Currently well controlled. Follow-up in 6 months, continue Wellbutrin 300 mg daily, worsening precautions advised.  Medication side effect Provided information from UpToDate regarding estrogen and dementia:   "Among the oldest studied in the [facility], there was no improvement in memory or thinking with either estrogen alone or with combined estrogen-progestin but there was an increase in the risk of developing dementia. No increase in dementia risk was seen in the younger menopausal women in the [facility] or in  other studies. Some experts think that estrogen treatment might be helpful for preventing dementia if you take it in the earliest years after menopause (although this is not proven); taking it many years after menopause seems to be harmful."   Other orders -     buPROPion (WELLBUTRIN XL) 300 MG 24 hr tablet; Take 1 tablet (300 mg total) by mouth daily.    . Reviewed expectations re: course of current medical issues. . Discussed self-management of symptoms. . Outlined signs and symptoms indicating need for more acute intervention. . Patient verbalized understanding and all questions were answered. . See orders for this visit as documented in the electronic medical record. . Patient received an After Visit Summary.  CMA or LPN served as scribe during this visit. History, Physical, and Plan performed by medical provider. The above documentation has been reviewed and is accurate and complete.  Inda Coke, PA-C [place] [place] 11/28/2019  Follow-up: No follow-ups on file.

## 2019-11-29 ENCOUNTER — Ambulatory Visit (INDEPENDENT_AMBULATORY_CARE_PROVIDER_SITE_OTHER): Payer: BC Managed Care – PPO | Admitting: Podiatry

## 2019-11-29 ENCOUNTER — Encounter: Payer: Self-pay | Admitting: Podiatry

## 2019-11-29 ENCOUNTER — Other Ambulatory Visit: Payer: Self-pay

## 2019-11-29 DIAGNOSIS — M7660 Achilles tendinitis, unspecified leg: Secondary | ICD-10-CM | POA: Diagnosis not present

## 2019-11-29 DIAGNOSIS — M7732 Calcaneal spur, left foot: Secondary | ICD-10-CM | POA: Diagnosis not present

## 2019-11-29 DIAGNOSIS — M216X2 Other acquired deformities of left foot: Secondary | ICD-10-CM | POA: Diagnosis not present

## 2019-12-01 ENCOUNTER — Ambulatory Visit: Payer: BC Managed Care – PPO | Admitting: Physician Assistant

## 2019-12-01 ENCOUNTER — Encounter: Payer: BC Managed Care – PPO | Admitting: Podiatry

## 2019-12-07 NOTE — Progress Notes (Signed)
Subjective: Jenna Pierce is a 54 y.o. is seen today in office s/p left posterior calcaneal spur resection with Achilles tenolysis and gastrocnemius recession preformed on 11/16/2019.  She states that she is doing well did not take any pain medication.  She has been nonweightbearing denies any recent injury or falls. Denies any systemic complaints such as fevers, chills, nausea, vomiting. No calf pain, chest pain, shortness of breath.   Objective: General: No acute distress, AAOx3  DP/PT pulses palpable 2/4, CRT < 3 sec to all digits.  Protective sensation intact. Motor function intact.  LEFT foot: Incision is well coapted without any evidence of dehiscence with sutures, staples intact. There is no surrounding erythema, ascending cellulitis, fluctuance, crepitus, malodor, drainage/purulence. There is decreased edema around the surgical site mostly on the central aspect of the heel spur resection incision. There is minimal pain along the surgical site.  No other areas of tenderness to bilateral lower extremities.  No other open lesions or pre-ulcerative lesions.  No pain with calf compression, swelling, warmth, erythema.   Assessment and Plan:  Status post left foot surgery, doing well with no complications   -Treatment options discussed including all alternatives, risks, and complications -Today cast was changed.  Sutures were removed but staples remain intact.  Antibiotic ointment was applied followed by dressing.  A well-padded below-knee fiberglass cast was applied making sure to pad all bony prominences.  Continue nonweightbearing.  Ice elevation.  81 mg aspirin.  No follow-ups on file.  Trula Slade DPM

## 2019-12-08 ENCOUNTER — Other Ambulatory Visit: Payer: Self-pay

## 2019-12-08 ENCOUNTER — Ambulatory Visit (INDEPENDENT_AMBULATORY_CARE_PROVIDER_SITE_OTHER): Payer: BC Managed Care – PPO | Admitting: Podiatry

## 2019-12-08 DIAGNOSIS — M7732 Calcaneal spur, left foot: Secondary | ICD-10-CM

## 2019-12-08 DIAGNOSIS — M7660 Achilles tendinitis, unspecified leg: Secondary | ICD-10-CM | POA: Diagnosis not present

## 2019-12-08 DIAGNOSIS — M216X2 Other acquired deformities of left foot: Secondary | ICD-10-CM

## 2019-12-09 NOTE — Progress Notes (Signed)
Subjective: Jenna Pierce is a 54 y.o. is seen today in office s/p left posterior calcaneal spur resection with Achilles tenolysis and gastrocnemius recession preformed on 11/16/2019.  She states that overall she is doing well not having any significant discomfort.  She has been nonweightbearing she has no new concerns.  Denies any systemic complaints such as fevers, chills, nausea, vomiting. No calf pain, chest pain, shortness of breath.   Objective: General: No acute distress, AAOx3  DP/PT pulses palpable 2/4, CRT < 3 sec to all digits.  Protective sensation intact. Motor function intact.  LEFT foot: Incision is well coapted without any evidence of dehiscence with sutures, staples intact. There is no surrounding erythema, ascending cellulitis, fluctuance, crepitus, malodor, drainage/purulence. There is improved and mild edema around the surgical site. There is no significant pain along the surgical site.  No other areas of tenderness to bilateral lower extremities.  No other open lesions or pre-ulcerative lesions.  No pain with calf compression, swelling, warmth, erythema.   Assessment and Plan:  Status post left foot surgery, doing well with no complications   -Treatment options discussed including all alternatives, risks, and complications -Today cast was changed.  Remainder the sutures were removed but staples remain intact.  All the staples intact.  Antibiotic ointment was applied followed by dressing.  A well-padded below-knee fiberglass cast was applied making sure to pad all bony prominences.  Continue nonweightbearing.  Ice elevation.  81 mg aspirin.  Remove cast and likely remove at least half of the staples if not all.  Transition to CAM boot and start passive range of motion exercises but remain nonweightbearing I will see her back 2 weeks after that to likely start physical therapy.  Trula Slade DPM

## 2019-12-15 ENCOUNTER — Encounter: Payer: BC Managed Care – PPO | Admitting: Podiatry

## 2019-12-19 ENCOUNTER — Other Ambulatory Visit: Payer: Self-pay

## 2019-12-19 ENCOUNTER — Ambulatory Visit (INDEPENDENT_AMBULATORY_CARE_PROVIDER_SITE_OTHER): Payer: BC Managed Care – PPO | Admitting: Podiatry

## 2019-12-19 ENCOUNTER — Ambulatory Visit: Payer: BC Managed Care – PPO

## 2019-12-19 ENCOUNTER — Encounter: Payer: BC Managed Care – PPO | Admitting: Podiatry

## 2019-12-19 DIAGNOSIS — M7731 Calcaneal spur, right foot: Secondary | ICD-10-CM

## 2019-12-19 DIAGNOSIS — M7732 Calcaneal spur, left foot: Secondary | ICD-10-CM

## 2019-12-20 ENCOUNTER — Encounter: Payer: Self-pay | Admitting: Physician Assistant

## 2019-12-29 DIAGNOSIS — G4733 Obstructive sleep apnea (adult) (pediatric): Secondary | ICD-10-CM | POA: Diagnosis not present

## 2020-01-03 ENCOUNTER — Other Ambulatory Visit: Payer: Self-pay

## 2020-01-03 ENCOUNTER — Ambulatory Visit (INDEPENDENT_AMBULATORY_CARE_PROVIDER_SITE_OTHER): Payer: BC Managed Care – PPO | Admitting: Podiatry

## 2020-01-03 DIAGNOSIS — M7660 Achilles tendinitis, unspecified leg: Secondary | ICD-10-CM

## 2020-01-03 DIAGNOSIS — M7732 Calcaneal spur, left foot: Secondary | ICD-10-CM

## 2020-01-03 DIAGNOSIS — M216X2 Other acquired deformities of left foot: Secondary | ICD-10-CM

## 2020-01-03 MED ORDER — EFINACONAZOLE 10 % EX SOLN: 1.0000 [drp] | Freq: Every day | CUTANEOUS | 11 refills | Status: DC

## 2020-01-09 NOTE — Progress Notes (Signed)
Subjective: Jenna Pierce is a 54 y.o. is seen today in office s/p left posterior calcaneal spur resection with Achilles tenolysis and gastrocnemius recession preformed on 11/16/2019.  She states that she has been doing well.  She does admit that she has been walking in the cam boot she is even gone without the cam boot times.  She states that she not had any significant discomfort with doing this.  She is asking for physical therapy.  No recent injury or falls. Denies any systemic complaints such as fevers, chills, nausea, vomiting. No calf pain, chest pain, shortness of breath.   Objective: General: No acute distress, AAOx3  DP/PT pulses palpable 2/4, CRT < 3 sec to all digits.  Protective sensation intact. Motor function intact.  LEFT foot: Incision is well coapted without any evidence of dehiscence. There is a small scab on the inferior portion of the incision. There is no drainage or pus.  No pain.  Minimal swelling.  Thompson test is negative.  MMT 5/5. No other open lesions or pre-ulcerative lesions.  No pain with calf compression, swelling, warmth, erythema.   Assessment and Plan:  Status post left foot surgery, doing well with no complications   -Treatment options discussed including all alternatives, risks, and complications -She has been doing well.  Range of motion and strength appears to be improved.  Discussed transition to partial weightbearing.  Prescription for physical therapy [facility].  Continue to ice elevate.  I do not want her going without the cam boot.  Return in about 2 weeks (around 01/17/2020).  Jenna Pierce DPM

## 2020-01-09 NOTE — Addendum Note (Signed)
Addended by: [place] Mon R on: 01/09/2020 10:30 AM   Modules accepted: Orders

## 2020-01-12 DIAGNOSIS — Z4789 Encounter for other orthopedic aftercare: Secondary | ICD-10-CM | POA: Diagnosis not present

## 2020-01-12 DIAGNOSIS — M25572 Pain in left ankle and joints of left foot: Secondary | ICD-10-CM | POA: Diagnosis not present

## 2020-01-12 DIAGNOSIS — M25672 Stiffness of left ankle, not elsewhere classified: Secondary | ICD-10-CM | POA: Diagnosis not present

## 2020-01-12 DIAGNOSIS — R262 Difficulty in walking, not elsewhere classified: Secondary | ICD-10-CM | POA: Diagnosis not present

## 2020-01-17 ENCOUNTER — Encounter: Payer: Self-pay | Admitting: Podiatry

## 2020-01-17 ENCOUNTER — Ambulatory Visit (INDEPENDENT_AMBULATORY_CARE_PROVIDER_SITE_OTHER): Payer: BC Managed Care – PPO | Admitting: Podiatry

## 2020-01-17 ENCOUNTER — Other Ambulatory Visit: Payer: Self-pay

## 2020-01-17 DIAGNOSIS — R262 Difficulty in walking, not elsewhere classified: Secondary | ICD-10-CM | POA: Diagnosis not present

## 2020-01-17 DIAGNOSIS — Z4789 Encounter for other orthopedic aftercare: Secondary | ICD-10-CM | POA: Diagnosis not present

## 2020-01-17 DIAGNOSIS — M7660 Achilles tendinitis, unspecified leg: Secondary | ICD-10-CM

## 2020-01-17 DIAGNOSIS — M7732 Calcaneal spur, left foot: Secondary | ICD-10-CM

## 2020-01-17 DIAGNOSIS — M25672 Stiffness of left ankle, not elsewhere classified: Secondary | ICD-10-CM | POA: Diagnosis not present

## 2020-01-17 DIAGNOSIS — M25572 Pain in left ankle and joints of left foot: Secondary | ICD-10-CM | POA: Diagnosis not present

## 2020-01-18 NOTE — Progress Notes (Signed)
Subjective: Jenna Pierce is a 54 y.o. is seen today in office s/p left posterior calcaneal spur resection with Achilles tenolysis and gastrocnemius recession preformed on 11/16/2019.  Overall she states that she is doing better and she only has a little discomfort when she overdoes it.  She had a scab on the bottom of the incision which did come off last week and the incision is healed well.  Denies any redness or drainage.  Swelling is intermittent and depends on how much she is on her foot.  She does wear the boot but she also admits that she goes without the boot as well. Denies any systemic complaints such as fevers, chills, nausea, vomiting. No calf pain, chest pain, shortness of breath.   Objective: General: No acute distress, AAOx3  DP/PT pulses palpable 2/4, CRT < 3 sec to all digits.  Protective sensation intact. Motor function intact.  LEFT foot: Incision is well coapted without any evidence of dehiscence.  Eschar is well formed.  There is minimal swelling to the area and there is no erythema or warmth.  Mild discomfort on the incision site.  No other areas of discomfort.  Negative Thompson test.  No other open lesions or pre-ulcerative lesions.  No pain with calf compression, swelling, warmth, erythema.   Assessment and Plan:  Status post left foot surgery, doing well with no complications   -Treatment options discussed including all alternatives, risks, and complications -Overall she is doing well.  Continue physical therapy.  She is walking in the boot she is even gone without the boot at times.  We discussed gradual transition back into regular shoe.  I want her to wear the Achilles gel sleeve to avoid any pressure when she wears a shoe.  Discussed gradual transition.  Return in about 3 weeks (around 02/07/2020).  Trula Slade DPM

## 2020-01-19 DIAGNOSIS — M25572 Pain in left ankle and joints of left foot: Secondary | ICD-10-CM | POA: Diagnosis not present

## 2020-01-19 DIAGNOSIS — M25672 Stiffness of left ankle, not elsewhere classified: Secondary | ICD-10-CM | POA: Diagnosis not present

## 2020-01-19 DIAGNOSIS — R262 Difficulty in walking, not elsewhere classified: Secondary | ICD-10-CM | POA: Diagnosis not present

## 2020-01-19 DIAGNOSIS — Z4789 Encounter for other orthopedic aftercare: Secondary | ICD-10-CM | POA: Diagnosis not present

## 2020-01-24 DIAGNOSIS — Z4789 Encounter for other orthopedic aftercare: Secondary | ICD-10-CM | POA: Diagnosis not present

## 2020-01-24 DIAGNOSIS — M25672 Stiffness of left ankle, not elsewhere classified: Secondary | ICD-10-CM | POA: Diagnosis not present

## 2020-01-24 DIAGNOSIS — R262 Difficulty in walking, not elsewhere classified: Secondary | ICD-10-CM | POA: Diagnosis not present

## 2020-01-24 DIAGNOSIS — M25572 Pain in left ankle and joints of left foot: Secondary | ICD-10-CM | POA: Diagnosis not present

## 2020-01-26 DIAGNOSIS — M25572 Pain in left ankle and joints of left foot: Secondary | ICD-10-CM | POA: Diagnosis not present

## 2020-01-26 DIAGNOSIS — M25672 Stiffness of left ankle, not elsewhere classified: Secondary | ICD-10-CM | POA: Diagnosis not present

## 2020-01-26 DIAGNOSIS — Z4789 Encounter for other orthopedic aftercare: Secondary | ICD-10-CM | POA: Diagnosis not present

## 2020-01-26 DIAGNOSIS — R262 Difficulty in walking, not elsewhere classified: Secondary | ICD-10-CM | POA: Diagnosis not present

## 2020-01-28 DIAGNOSIS — Z20828 Contact with and (suspected) exposure to other viral communicable diseases: Secondary | ICD-10-CM | POA: Diagnosis not present

## 2020-01-28 DIAGNOSIS — U071 COVID-19: Secondary | ICD-10-CM | POA: Diagnosis not present

## 2020-02-07 ENCOUNTER — Encounter: Payer: BC Managed Care – PPO | Admitting: Podiatry

## 2020-02-21 ENCOUNTER — Ambulatory Visit (INDEPENDENT_AMBULATORY_CARE_PROVIDER_SITE_OTHER): Payer: BC Managed Care – PPO | Admitting: Podiatry

## 2020-02-21 ENCOUNTER — Encounter: Payer: Self-pay | Admitting: Podiatry

## 2020-02-21 ENCOUNTER — Other Ambulatory Visit: Payer: Self-pay

## 2020-02-21 VITALS — Temp 98.0°F

## 2020-02-21 DIAGNOSIS — M7732 Calcaneal spur, left foot: Secondary | ICD-10-CM

## 2020-02-21 DIAGNOSIS — M7660 Achilles tendinitis, unspecified leg: Secondary | ICD-10-CM

## 2020-02-23 NOTE — Progress Notes (Signed)
Subjective: Jenna Pierce is a 54 y.o. is seen today in office s/p left posterior calcaneal spur resection with Achilles tenolysis and gastrocnemius recession preformed on 11/16/2019.  She states that she is doing well.  She is having some tightness on the actual scar otherwise doing well.  She has been wearing sandals with open back.  She recently just got back from [place].  She was going barefoot son and she did do quite a bit of walking.  She has some swelling after being on her feet for too long but otherwise doing well.  She also has not been to physical therapy in 3 and half weeks because before she went to [place] she tested positive for COVID as well. Denies any systemic complaints such as fevers, chills, nausea, vomiting. No calf pain, chest pain, shortness of breath.   Objective: General: No acute distress, AAOx3  DP/PT pulses palpable 2/4, CRT < 3 sec to all digits.  Protective sensation intact. Motor function intact.  LEFT foot: Incision is well coapted without any evidence of dehiscence and a scar has well formed.  Mild hypertrophy of the scar present.  No significant discomfort at the surgical site.  Minimal edema but there is no erythema or warmth. No other open lesions or pre-ulcerative lesions.  No pain with calf compression, swelling, warmth, erythema.   Assessment and Plan:  Status post left foot surgery, doing well   -Treatment options discussed including all alternatives, risks, and complications -Discussed vitamin E cream to the incision daily.  She has not been keeping any on the incision recently.  Also on her start physical therapy again.  She is in contact [facility] for an appointment.  Continue with supportive shoes.  Home stretching, rehab exercises discussed as well.  Return in about 6 weeks (around 04/03/2020).  Trula Slade DPM

## 2020-02-29 DIAGNOSIS — G4733 Obstructive sleep apnea (adult) (pediatric): Secondary | ICD-10-CM | POA: Diagnosis not present

## 2020-03-05 DIAGNOSIS — N3946 Mixed incontinence: Secondary | ICD-10-CM | POA: Diagnosis not present

## 2020-03-05 DIAGNOSIS — N952 Postmenopausal atrophic vaginitis: Secondary | ICD-10-CM | POA: Diagnosis not present

## 2020-03-06 DIAGNOSIS — R262 Difficulty in walking, not elsewhere classified: Secondary | ICD-10-CM | POA: Diagnosis not present

## 2020-03-06 DIAGNOSIS — Z4789 Encounter for other orthopedic aftercare: Secondary | ICD-10-CM | POA: Diagnosis not present

## 2020-03-06 DIAGNOSIS — M25572 Pain in left ankle and joints of left foot: Secondary | ICD-10-CM | POA: Diagnosis not present

## 2020-03-06 DIAGNOSIS — M25672 Stiffness of left ankle, not elsewhere classified: Secondary | ICD-10-CM | POA: Diagnosis not present

## 2020-03-08 DIAGNOSIS — R262 Difficulty in walking, not elsewhere classified: Secondary | ICD-10-CM | POA: Diagnosis not present

## 2020-03-08 DIAGNOSIS — M25672 Stiffness of left ankle, not elsewhere classified: Secondary | ICD-10-CM | POA: Diagnosis not present

## 2020-03-08 DIAGNOSIS — M25572 Pain in left ankle and joints of left foot: Secondary | ICD-10-CM | POA: Diagnosis not present

## 2020-03-08 DIAGNOSIS — Z4789 Encounter for other orthopedic aftercare: Secondary | ICD-10-CM | POA: Diagnosis not present

## 2020-03-13 DIAGNOSIS — Z4789 Encounter for other orthopedic aftercare: Secondary | ICD-10-CM | POA: Diagnosis not present

## 2020-03-13 DIAGNOSIS — M25572 Pain in left ankle and joints of left foot: Secondary | ICD-10-CM | POA: Diagnosis not present

## 2020-03-13 DIAGNOSIS — R262 Difficulty in walking, not elsewhere classified: Secondary | ICD-10-CM | POA: Diagnosis not present

## 2020-03-13 DIAGNOSIS — M25672 Stiffness of left ankle, not elsewhere classified: Secondary | ICD-10-CM | POA: Diagnosis not present

## 2020-03-15 DIAGNOSIS — M25672 Stiffness of left ankle, not elsewhere classified: Secondary | ICD-10-CM | POA: Diagnosis not present

## 2020-03-15 DIAGNOSIS — R262 Difficulty in walking, not elsewhere classified: Secondary | ICD-10-CM | POA: Diagnosis not present

## 2020-03-15 DIAGNOSIS — Z4789 Encounter for other orthopedic aftercare: Secondary | ICD-10-CM | POA: Diagnosis not present

## 2020-03-15 DIAGNOSIS — M25572 Pain in left ankle and joints of left foot: Secondary | ICD-10-CM | POA: Diagnosis not present

## 2020-03-22 DIAGNOSIS — N952 Postmenopausal atrophic vaginitis: Secondary | ICD-10-CM | POA: Diagnosis not present

## 2020-03-22 DIAGNOSIS — M25572 Pain in left ankle and joints of left foot: Secondary | ICD-10-CM | POA: Diagnosis not present

## 2020-03-22 DIAGNOSIS — R262 Difficulty in walking, not elsewhere classified: Secondary | ICD-10-CM | POA: Diagnosis not present

## 2020-03-22 DIAGNOSIS — Z4789 Encounter for other orthopedic aftercare: Secondary | ICD-10-CM | POA: Diagnosis not present

## 2020-03-22 DIAGNOSIS — N3946 Mixed incontinence: Secondary | ICD-10-CM | POA: Diagnosis not present

## 2020-03-22 DIAGNOSIS — M25672 Stiffness of left ankle, not elsewhere classified: Secondary | ICD-10-CM | POA: Diagnosis not present

## 2020-03-27 DIAGNOSIS — N3946 Mixed incontinence: Secondary | ICD-10-CM | POA: Diagnosis not present

## 2020-03-27 DIAGNOSIS — N949 Unspecified condition associated with female genital organs and menstrual cycle: Secondary | ICD-10-CM | POA: Diagnosis not present

## 2020-03-27 DIAGNOSIS — M25672 Stiffness of left ankle, not elsewhere classified: Secondary | ICD-10-CM | POA: Diagnosis not present

## 2020-03-27 DIAGNOSIS — Z4789 Encounter for other orthopedic aftercare: Secondary | ICD-10-CM | POA: Diagnosis not present

## 2020-03-27 DIAGNOSIS — R262 Difficulty in walking, not elsewhere classified: Secondary | ICD-10-CM | POA: Diagnosis not present

## 2020-03-27 DIAGNOSIS — M25572 Pain in left ankle and joints of left foot: Secondary | ICD-10-CM | POA: Diagnosis not present

## 2020-03-29 ENCOUNTER — Ambulatory Visit (INDEPENDENT_AMBULATORY_CARE_PROVIDER_SITE_OTHER): Payer: BC Managed Care – PPO | Admitting: Podiatry

## 2020-03-29 ENCOUNTER — Other Ambulatory Visit: Payer: Self-pay

## 2020-03-29 DIAGNOSIS — M7660 Achilles tendinitis, unspecified leg: Secondary | ICD-10-CM | POA: Diagnosis not present

## 2020-03-29 DIAGNOSIS — M216X2 Other acquired deformities of left foot: Secondary | ICD-10-CM | POA: Diagnosis not present

## 2020-03-29 DIAGNOSIS — M7732 Calcaneal spur, left foot: Secondary | ICD-10-CM

## 2020-03-30 NOTE — Progress Notes (Signed)
Subjective: Jenna Pierce is a 54 y.o. is seen today in office s/p left posterior calcaneal spur resection with Achilles tenolysis and gastrocnemius recession preformed on 11/16/2019.  Overall she states that she is doing better she has been making progress.  She is wearing regular shoes.  She has been going to physical therapy which is been helpful.  She is scheduled continue therapy but she is can be going to the beach mostly for the summer and says she thinks she can do 2 more sessions.  She does feel that she can do the same exercises at home.  She states that she feels that she is at where she should be at this point as she is at the other side done. Denies any systemic complaints such as fevers, chills, nausea, vomiting. No calf pain, chest pain, shortness of breath.   Objective: General: No acute distress, AAOx3  DP/PT pulses palpable 2/4, CRT < 3 sec to all digits.  Protective sensation intact. Motor function intact.  LEFT foot: Incision is well coapted without any evidence of dehiscence and a scar has well formed.  There is slight tenderness palpation to the on the incision but there is no pain to the calcaneus at the Achilles tendon.  Thompson test is negative.  There is no significant edema there is no erythema or warmth. No other open lesions or pre-ulcerative lesions.  No pain with calf compression, swelling, warmth, erythema.   Assessment and Plan:  Status post left foot surgery, doing well   -Treatment options discussed including all alternatives, risks, and complications -We will have her continue physical therapy for now.  Once she goes to the beach continue home physical therapy and continue vitamin D/cocoa butter to the incision daily.  Continue wearing supportive shoes.  As she is doing better and she is can be gone most of the summer we will see her back as needed but encouraged to call with any questions concerns or any changes.  I would be happy to do a virtual visit if  needed as she is out of town.  No follow-ups on file.  Trula Slade DPM

## 2020-07-09 ENCOUNTER — Other Ambulatory Visit: Payer: Self-pay | Admitting: Physician Assistant

## 2020-07-25 DIAGNOSIS — G4733 Obstructive sleep apnea (adult) (pediatric): Secondary | ICD-10-CM | POA: Diagnosis not present

## 2020-07-26 DIAGNOSIS — R0781 Pleurodynia: Secondary | ICD-10-CM | POA: Diagnosis not present

## 2020-08-02 DIAGNOSIS — N6310 Unspecified lump in the right breast, unspecified quadrant: Secondary | ICD-10-CM | POA: Diagnosis not present

## 2020-09-12 ENCOUNTER — Encounter: Payer: Self-pay | Admitting: Physician Assistant

## 2020-09-12 ENCOUNTER — Other Ambulatory Visit: Payer: Self-pay

## 2020-09-12 ENCOUNTER — Ambulatory Visit (INDEPENDENT_AMBULATORY_CARE_PROVIDER_SITE_OTHER): Payer: BC Managed Care – PPO | Admitting: Physician Assistant

## 2020-09-12 VITALS — BP 116/70 | HR 77 | Temp 97.2°F | Ht 66.0 in | Wt 248.4 lb

## 2020-09-12 DIAGNOSIS — F320 Major depressive disorder, single episode, mild: Secondary | ICD-10-CM | POA: Diagnosis not present

## 2020-09-12 DIAGNOSIS — E038 Other specified hypothyroidism: Secondary | ICD-10-CM | POA: Diagnosis not present

## 2020-09-12 DIAGNOSIS — Z0000 Encounter for general adult medical examination without abnormal findings: Secondary | ICD-10-CM | POA: Diagnosis not present

## 2020-09-12 DIAGNOSIS — Z1159 Encounter for screening for other viral diseases: Secondary | ICD-10-CM

## 2020-09-12 DIAGNOSIS — N951 Menopausal and female climacteric states: Secondary | ICD-10-CM

## 2020-09-12 DIAGNOSIS — Z13220 Encounter for screening for lipoid disorders: Secondary | ICD-10-CM

## 2020-09-12 DIAGNOSIS — Z1211 Encounter for screening for malignant neoplasm of colon: Secondary | ICD-10-CM

## 2020-09-12 DIAGNOSIS — E669 Obesity, unspecified: Secondary | ICD-10-CM

## 2020-09-12 MED ORDER — BUPROPION HCL ER (XL) 300 MG PO TB24: 300.0000 mg | ORAL_TABLET | Freq: Every day | ORAL | 3 refills | Status: DC

## 2020-09-12 NOTE — Patient Instructions (Signed)
It was great to see you!      Please make an appointment with the lab on your way out. I would like for you to return for lab work within 1-2 weeks. After midnight on the day of the lab draw, please do not eat anything. You may have water, black coffee, unsweetened tea.  Please go to the lab for blood work.   Our office will call you with your results unless you have chosen to receive results via MyChart.  If your blood work is normal we will follow-up each year for physicals and as scheduled for chronic medical problems.  If anything is abnormal we will treat accordingly and get you in for a follow-up.  Take care,  [facility] Maintenance, Female Adopting a healthy lifestyle and getting preventive care are important in promoting health and wellness. Ask your health care provider about:  The right schedule for you to have regular tests and exams.  Things you can do on your own to prevent diseases and keep yourself healthy. What should I know about diet, weight, and exercise? Eat a healthy diet   Eat a diet that includes plenty of vegetables, fruits, low-fat dairy products, and lean protein.  Do not eat a lot of foods that are high in solid fats, added sugars, or sodium. Maintain a healthy weight Body mass index (BMI) is used to identify weight problems. It estimates body fat based on height and weight. Your health care provider can help determine your BMI and help you achieve or maintain a healthy weight. Get regular exercise Get regular exercise. This is one of the most important things you can do for your health. Most adults should:  Exercise for at least 150 minutes each week. The exercise should increase your heart rate and make you sweat (moderate-intensity exercise).  Do strengthening exercises at least twice a week. This is in addition to the moderate-intensity exercise.  Spend less time sitting. Even light physical activity can be beneficial. Watch  cholesterol and blood lipids Have your blood tested for lipids and cholesterol at 52 years of age, then have this test every 5 years. Have your cholesterol levels checked more often if:  Your lipid or cholesterol levels are high.  You are older than 52 years of age.  You are at high risk for heart disease. What should I know about cancer screening? Depending on your health history and family history, you may need to have cancer screening at various ages. This may include screening for:  Breast cancer.  Cervical cancer.  Colorectal cancer.  Skin cancer.  Lung cancer. What should I know about heart disease, diabetes, and high blood pressure? Blood pressure and heart disease  High blood pressure causes heart disease and increases the risk of stroke. This is more likely to develop in people who have high blood pressure readings, are of African descent, or are overweight.  Have your blood pressure checked: ? Every 3-5 years if you are 71-21 years of age. ? Every year if you are 65 years old or older. Diabetes Have regular diabetes screenings. This checks your fasting blood sugar level. Have the screening done:  Once every three years after age 48 if you are at a normal weight and have a low risk for diabetes.  More often and at a younger age if you are overweight or have a high risk for diabetes. What should I know about preventing infection? Hepatitis B If you have a higher risk for  hepatitis B, you should be screened for this virus. Talk with your health care provider to find out if you are at risk for hepatitis B infection. Hepatitis C Testing is recommended for:  Everyone born from [birth year] through 1963.  Anyone with known risk factors for hepatitis C. Sexually transmitted infections (STIs)  Get screened for STIs, including gonorrhea and chlamydia, if: ? You are sexually active and are younger than 52 years of age. ? You are older than 52 years of age and your health care  provider tells you that you are at risk for this type of infection. ? Your sexual activity has changed since you were last screened, and you are at increased risk for chlamydia or gonorrhea. Ask your health care provider if you are at risk.  Ask your health care provider about whether you are at high risk for HIV. Your health care provider may recommend a prescription medicine to help prevent HIV infection. If you choose to take medicine to prevent HIV, you should first get tested for HIV. You should then be tested every 3 months for as long as you are taking the medicine. Pregnancy  If you are about to stop having your period (premenopausal) and you may become pregnant, seek counseling before you get pregnant.  Take 400 to 800 micrograms (mcg) of folic acid every day if you become pregnant.  Ask for birth control (contraception) if you want to prevent pregnancy. Osteoporosis and menopause Osteoporosis is a disease in which the bones lose minerals and strength with aging. This can result in bone fractures. If you are 63 years old or older, or if you are at risk for osteoporosis and fractures, ask your health care provider if you should:  Be screened for bone loss.  Take a calcium or vitamin D supplement to lower your risk of fractures.  Be given hormone replacement therapy (HRT) to treat symptoms of menopause. Follow these instructions at home: Lifestyle  Do not use any products that contain nicotine or tobacco, such as cigarettes, e-cigarettes, and chewing tobacco. If you need help quitting, ask your health care provider.  Do not use street drugs.  Do not share needles.  Ask your health care provider for help if you need support or information about quitting drugs. Alcohol use  Do not drink alcohol if: ? Your health care provider tells you not to drink. ? You are pregnant, may be pregnant, or are planning to become pregnant.  If you drink alcohol: ? Limit how much you use to 0-1  drink a day. ? Limit intake if you are breastfeeding.  Be aware of how much alcohol is in your drink. In the [place] one drink equals one 12 oz bottle of beer (355 mL), one 5 oz glass of wine (148 mL), or one 1 oz glass of hard liquor (44 mL). General instructions  Schedule regular health, dental, and eye exams.  Stay current with your vaccines.  Tell your health care provider if: ? You often feel depressed. ? You have ever been abused or do not feel safe at home. Summary  Adopting a healthy lifestyle and getting preventive care are important in promoting health and wellness.  Follow your health care provider's instructions about healthy diet, exercising, and getting tested or screened for diseases.  Follow your health care provider's instructions on monitoring your cholesterol and blood pressure. This information is not intended to replace advice given to you by your health care provider. Make sure you discuss any questions  you have with your health care provider. Document Revised: 10/27/2018 Document Reviewed: 10/27/2018 Elsevier Patient Education  2020 [employer].

## 2020-09-12 NOTE — Progress Notes (Signed)
I acted as a Education administrator for [employer], PA-C Anselmo Pickler, LPN    Subjective:    Jenna Pierce is a 54 y.o. female and is here for a comprehensive physical exam.   HPI  Health Maintenance Due  Topic Date Due  . Hepatitis C Screening  Never done  . COLONOSCOPY  Never done    Acute Concerns: Menopausal symptoms -- reports that she has concerns regarding going through menopause. Had a full hysterectomy in 2016. Had estrogen patch for a bit but stopped for unknown reasons. Ob prescribed her oral estrogen but she never took it, she is concerned about side effects given mother had dementia. Main symptoms are "brain fog, low energy, insomnia, low libido."  Chronic Issues: Subclinical hypothyroidism -- hx of elevated TSH as recently as 1 year ago. Never on rx. Would like this rechecked has had weight gain, low energy. Depression -- stable. Currently on wellbutrin 300 mg dailiy. Tolerating well. Does have situational anxiety with son graduating soon and she is moving to [place] next fall. Denies SI/HI.  Health Maintenance: Immunizations -- declined all vaccines Colonoscopy -- will send referral  Mammogram -- overdue , scheduled for Nov 14th PAP -- N/A Bone Density -- N/A Diet -- knows what to do, just needs to do it!  Sleep habits -- no major concerns; wakes up thinking about things Exercise -- unable to do much high impact stuff 2/2 recent foot surgeries Weight -- Weight: 248 lb 6.1 oz (112.7 kg)  Mood -- overall stable, see above Weight history: Wt Readings from Last 10 Encounters:  09/12/20 248 lb 6.1 oz (112.7 kg)  11/28/19 224 lb (101.6 kg)  04/18/19 264 lb (119.7 kg)  01/27/19 264 lb (119.7 kg)  01/03/19 264 lb 9.6 oz (120 kg)  02/23/18 258 lb 3.2 oz (117.1 kg)  02/02/18 256 lb 6.4 oz (116.3 kg)  08/21/17 254 lb 8 oz (115.4 kg)  07/02/16 225 lb (102.1 kg)  09/17/15 233 lb 3.2 oz (105.8 kg)   Body mass index is 40.09 kg/m. Patient's last menstrual period  was 08/19/2015 (exact date). Period characteristics: Alcohol use: not significant Tobacco use: no smoking  Depression screen PHQ 2/9 09/12/2020  Decreased Interest 0  Down, Depressed, Hopeless 1  PHQ - 2 Score 1  Altered sleeping 1  Tired, decreased energy 1  Change in appetite 1  Feeling bad or failure about yourself  0  Trouble concentrating 1  Moving slowly or fidgety/restless 0  Suicidal thoughts 0  PHQ-9 Score 5  Difficult doing work/chores Somewhat difficult     Other providers/specialists: Patient Care Team: Inda Coke, [place] as PCP - General (Physician Assistant)    PMHx, SurgHx, SocialHx, Medications, and Allergies were reviewed in the Visit Navigator and updated as appropriate.   Past Medical History:  Diagnosis Date  . Ovarian cyst      Past Surgical History:  Procedure Laterality Date  . ABDOMINAL HYSTERECTOMY    . BUNIONECTOMY Bilateral 2017   done 4 weeks apart  . distal radius repair Left 2016  . FRACTURE SURGERY    . pin and repair 5th metacarpal  2017  . ROBOTIC ASSISTED TOTAL HYSTERECTOMY WITH BILATERAL SALPINGO OOPHERECTOMY  08/24/2015   [facility]formed [facility]  . WRIST FRACTURE SURGERY Left 02/26/2015   hardware and screws placed     Family History  Problem Relation Age of Onset  . Diabetes Father   . Arthritis Father   . Hyperlipidemia Father   .  Hypertension Father   . Heart attack Father   . Breast cancer Paternal Grandmother   . Arthritis Mother   . COPD Mother   . Depression Mother   . Alzheimer's disease Mother   . Alcohol abuse Maternal Grandfather     Social History   Tobacco Use  . Smoking status: Never Smoker  . Smokeless tobacco: Never Used  Vaping Use  . Vaping Use: Never used  Substance Use Topics  . Alcohol use: Yes    Comment: 3 drinks a week wine or beer  . Drug use: No    Review of Systems:   Review of Systems  Constitutional: Negative for chills, fever, malaise/fatigue and  weight loss.  HENT: Negative for hearing loss, sinus pain and sore throat.   Respiratory: Negative for cough and hemoptysis.   Cardiovascular: Negative for chest pain, palpitations, leg swelling and PND.  Gastrointestinal: Negative for abdominal pain, constipation, diarrhea, heartburn, nausea and vomiting.  Genitourinary: Negative for dysuria, frequency and urgency.  Musculoskeletal: Negative for back pain, myalgias and neck pain.  Skin: Negative for itching and rash.  Neurological: Negative for dizziness, tingling, seizures and headaches.  Endo/Heme/Allergies: Negative for polydipsia.  Psychiatric/Behavioral: Negative for depression. The patient is not nervous/anxious.     Objective:   BP 116/70 (BP Location: Left Arm, Patient Position: Sitting, Cuff Size: Large)   Pulse 77   Temp (!) 97.2 F (36.2 C) (Temporal)   Ht 5\' 6"  (1.676 m)   Wt 248 lb 6.1 oz (112.7 kg)   LMP 08/19/2015 (Exact Date)   SpO2 95%   BMI 40.09 kg/m  Body mass index is 40.09 kg/m.   General Appearance:    Alert, cooperative, no distress, appears stated age  Head:    Normocephalic, without obvious abnormality, atraumatic  Eyes:    PERRL, conjunctiva/corneas clear, EOM's intact, fundi    benign, both eyes  Ears:    Normal TM's and external ear canals, both ears  Nose:   Nares normal, septum midline, mucosa normal, no drainage    or sinus tenderness  Throat:   Lips, mucosa, and tongue normal; teeth and gums normal  Neck:   Supple, symmetrical, trachea midline, no adenopathy;    thyroid:  no enlargement/tenderness/nodules; no carotid   bruit or JVD  Back:     Symmetric, no curvature, ROM normal, no CVA tenderness  Lungs:     Clear to auscultation bilaterally, respirations unlabored  Chest Wall:    No tenderness or deformity   Heart:    Regular rate and rhythm, S1 and S2 normal, no murmur, rub or gallop  Breast Exam:    Deferred  Abdomen:     Soft, non-tender, bowel sounds active all four quadrants,    no  masses, no organomegaly  Genitalia:    Deferred  Extremities:   Extremities normal, atraumatic, no cyanosis or edema  Pulses:   2+ and symmetric all extremities  Skin:   Skin color, texture, turgor normal, no rashes or lesions  Lymph nodes:   Cervical, supraclavicular, and axillary nodes normal  Neurologic:   CNII-XII intact, normal strength, sensation and reflexes    throughout     Assessment/Plan:   Katalia was seen today for annual exam.  Diagnoses and all orders for this visit:  Routine physical examination Today patient counseled on age appropriate routine health concerns for screening and prevention, each reviewed and up to date or declined. Immunizations reviewed and up to date or declined. Labs ordered and reviewed.  Risk factors for depression reviewed and negative. Hearing function and visual acuity are intact. ADLs screened and addressed as needed. Functional ability and level of safety reviewed and appropriate. Education, counseling and referrals performed based on assessed risks today. Patient provided with a copy of personalized plan for preventive services.  Subclinical hypothyroidism Update TSH and will add on T4 if indicated to investigate further. -     TSH; Future  Depression, major, single episode, mild (HCC) Overall stable. Continue wellbutrin 300 mg daily. Recommended that she take more time for herself. Work on getting back in with therapist. -     CBC with Differential/Platelet; Future -     Comprehensive metabolic panel; Future  Screening for lipid disorders -     Lipid panel; Future  Encounter for screening for other viral diseases -     Hepatitis C antibody; Future  Special screening for malignant neoplasms, colon -     Ambulatory referral to Gastroenterology  Menopausal symptoms Offered start of estrogen patch or referral to [facility]. She will think about referral. Encouraged healthier lifestyle.  Obesity Encouraged healthier  lifestyle.  Other orders -     buPROPion (WELLBUTRIN XL) 300 MG 24 hr tablet; Take 1 tablet (300 mg total) by mouth daily.   Well Adult Exam: Labs ordered: Yes. Patient counseling was done. See below for items discussed. Discussed the patient's BMI.  The BMI is not in the acceptable range; BMI management plan is completed Follow up in one year. Breast cancer screening: scheduled. Cervical cancer screening: n/a   Patient Counseling: [x]    Nutrition: Stressed importance of moderation in sodium/caffeine intake, saturated fat and cholesterol, caloric balance, sufficient intake of fresh fruits, vegetables, fiber, calcium, iron, and 1 mg of folate supplement per day (for females capable of pregnancy).  [x]    Stressed the importance of regular exercise.   [x]    Substance Abuse: Discussed cessation/primary prevention of tobacco, alcohol, or other drug use; driving or other dangerous activities under the influence; availability of treatment for abuse.   [x]    Injury prevention: Discussed safety belts, safety helmets, smoke detector, smoking near bedding or upholstery.   [x]    Sexuality: Discussed sexually transmitted diseases, partner selection, use of condoms, avoidance of unintended pregnancy  and contraceptive alternatives.  [x]    Dental health: Discussed importance of regular tooth brushing, flossing, and dental visits.  [x]    Health maintenance and immunizations reviewed. Please refer to Health maintenance section.   CMA or LPN served as scribe during this visit. History, Physical, and Plan performed by medical provider. The above documentation has been reviewed and is accurate and complete.   Inda Coke, PA-C [place]

## 2020-09-13 ENCOUNTER — Other Ambulatory Visit: Payer: BC Managed Care – PPO

## 2020-09-13 DIAGNOSIS — Z1159 Encounter for screening for other viral diseases: Secondary | ICD-10-CM

## 2020-09-13 DIAGNOSIS — Z13220 Encounter for screening for lipoid disorders: Secondary | ICD-10-CM

## 2020-09-13 DIAGNOSIS — E038 Other specified hypothyroidism: Secondary | ICD-10-CM | POA: Diagnosis not present

## 2020-09-13 DIAGNOSIS — F320 Major depressive disorder, single episode, mild: Secondary | ICD-10-CM

## 2020-09-14 LAB — CBC WITH DIFFERENTIAL/PLATELET
Absolute Monocytes: 437 cells/uL (ref 200–950)
Basophils Absolute: 41 cells/uL (ref 0–200)
Basophils Relative: 0.7 %
Eosinophils Absolute: 153 cells/uL (ref 15–500)
Eosinophils Relative: 2.6 %
HCT: 41.7 % (ref 35.0–45.0)
Hemoglobin: 14.1 g/dL (ref 11.7–15.5)
Lymphs Abs: 1776 cells/uL (ref 850–3900)
MCH: 30.8 pg (ref 27.0–33.0)
MCHC: 33.8 g/dL (ref 32.0–36.0)
MCV: 91 fL (ref 80.0–100.0)
MPV: 10.5 fL (ref 7.5–12.5)
Monocytes Relative: 7.4 %
Neutro Abs: 3493 cells/uL (ref 1500–7800)
Neutrophils Relative %: 59.2 %
Platelets: 227 10*3/uL (ref 140–400)
RBC: 4.58 10*6/uL (ref 3.80–5.10)
RDW: 12 % (ref 11.0–15.0)
Total Lymphocyte: 30.1 %
WBC: 5.9 10*3/uL (ref 3.8–10.8)

## 2020-09-14 LAB — COMPREHENSIVE METABOLIC PANEL
AG Ratio: 1.9 (calc) (ref 1.0–2.5)
ALT: 15 U/L (ref 6–29)
AST: 16 U/L (ref 10–35)
Albumin: 4.5 g/dL (ref 3.6–5.1)
Alkaline phosphatase (APISO): 113 U/L (ref 37–153)
BUN: 23 mg/dL (ref 7–25)
CO2: 28 mmol/L (ref 20–32)
Calcium: 10.1 mg/dL (ref 8.6–10.4)
Chloride: 105 mmol/L (ref 98–110)
Creat: 0.93 mg/dL (ref 0.50–1.05)
Globulin: 2.4 g/dL (calc) (ref 1.9–3.7)
Glucose, Bld: 88 mg/dL (ref 65–99)
Potassium: 4 mmol/L (ref 3.5–5.3)
Sodium: 141 mmol/L (ref 135–146)
Total Bilirubin: 0.6 mg/dL (ref 0.2–1.2)
Total Protein: 6.9 g/dL (ref 6.1–8.1)

## 2020-09-14 LAB — LIPID PANEL
Cholesterol: 188 mg/dL (ref <200)
HDL: 67 mg/dL (ref >=50)
LDL Cholesterol (Calc): 100 mg/dL (calc) — ABNORMAL HIGH
Non-HDL Cholesterol (Calc): 121 mg/dL (calc) (ref <130)
Total CHOL/HDL Ratio: 2.8 (calc) (ref <5.0)
Triglycerides: 110 mg/dL (ref <150)

## 2020-09-14 LAB — HEPATITIS C ANTIBODY
Hepatitis C Ab: NONREACTIVE
SIGNAL TO CUT-OFF: 0.01 (ref <1.00)

## 2020-09-14 LAB — TSH: TSH: 3.09 mIU/L

## 2020-09-25 ENCOUNTER — Encounter: Payer: Self-pay | Admitting: Gastroenterology

## 2020-10-09 DIAGNOSIS — Z1231 Encounter for screening mammogram for malignant neoplasm of breast: Secondary | ICD-10-CM | POA: Diagnosis not present

## 2020-10-30 ENCOUNTER — Ambulatory Visit (AMBULATORY_SURGERY_CENTER): Payer: Self-pay | Admitting: *Deleted

## 2020-10-30 ENCOUNTER — Other Ambulatory Visit: Payer: Self-pay

## 2020-10-30 VITALS — Ht 67.0 in | Wt 245.0 lb

## 2020-10-30 DIAGNOSIS — Z1211 Encounter for screening for malignant neoplasm of colon: Secondary | ICD-10-CM

## 2020-10-30 MED ORDER — SUTAB 1479-225-188 MG PO TABS: 1.0000 | ORAL_TABLET | Freq: Once | ORAL | 0 refills | Status: AC

## 2020-10-30 NOTE — Progress Notes (Signed)

## 2020-11-12 ENCOUNTER — Ambulatory Visit (INDEPENDENT_AMBULATORY_CARE_PROVIDER_SITE_OTHER): Payer: BC Managed Care – PPO

## 2020-11-12 ENCOUNTER — Other Ambulatory Visit: Payer: Self-pay

## 2020-11-12 ENCOUNTER — Ambulatory Visit (INDEPENDENT_AMBULATORY_CARE_PROVIDER_SITE_OTHER): Payer: BC Managed Care – PPO | Admitting: Podiatry

## 2020-11-12 DIAGNOSIS — M7732 Calcaneal spur, left foot: Secondary | ICD-10-CM

## 2020-11-12 DIAGNOSIS — M7662 Achilles tendinitis, left leg: Secondary | ICD-10-CM

## 2020-11-12 MED ORDER — DICLOFENAC SODIUM 1 % EX GEL: 2.0000 g | Freq: Four times a day (QID) | CUTANEOUS | 2 refills | Status: AC

## 2020-11-15 NOTE — Progress Notes (Signed)
Subjective: 54 year old female presents the office today for concerns of a possible reoccurrence of heel spur to the back of the left heel.  She should and she started to get some discomfort to the area.  She denies recent injury or falls.  She did have massage makes the area feel better. Denies any systemic complaints such as fevers, chills, nausea, vomiting. No acute changes since last appointment, and no other complaints at this time.   Objective: AAO x3, NAD DP/PT pulses palpable bilaterally, CRT less than 3 seconds To the posterior aspect of the left heel is a prominence present.  Majority tenderness is more along the distal portion Achilles tendon although minimal discomfort.  There is no significant edema and there is no erythema.  No pain with calf compression, swelling, warmth, erythema  Assessment: Insertional Achilles tendinitis left side, possible bone spur  Plan: -All treatment options discussed with the patient including all alternatives, risks, complications.  -X-ray today reviewed.  No significant recurrence of calcaneal spurring posteriorly. -Clinically does appear to be bone but I do think that some of this is coming more from scar tissue, tendinitis.  I would refer her back to physical therapy and also massage therapy will be helpful hopefully.  Voltaren gel as needed.  Heel lift as needed. -Patient encouraged to call the office with any questions, concerns, change in symptoms.   Vivi Barrack DPM

## 2020-11-20 DIAGNOSIS — M25651 Stiffness of right hip, not elsewhere classified: Secondary | ICD-10-CM | POA: Diagnosis not present

## 2020-11-20 DIAGNOSIS — R262 Difficulty in walking, not elsewhere classified: Secondary | ICD-10-CM | POA: Diagnosis not present

## 2020-11-20 DIAGNOSIS — M25572 Pain in left ankle and joints of left foot: Secondary | ICD-10-CM | POA: Diagnosis not present

## 2020-11-20 DIAGNOSIS — M25652 Stiffness of left hip, not elsewhere classified: Secondary | ICD-10-CM | POA: Diagnosis not present

## 2020-11-22 ENCOUNTER — Encounter: Payer: Self-pay | Admitting: Gastroenterology

## 2020-11-23 DIAGNOSIS — M25572 Pain in left ankle and joints of left foot: Secondary | ICD-10-CM | POA: Diagnosis not present

## 2020-11-23 DIAGNOSIS — M25652 Stiffness of left hip, not elsewhere classified: Secondary | ICD-10-CM | POA: Diagnosis not present

## 2020-11-23 DIAGNOSIS — M25651 Stiffness of right hip, not elsewhere classified: Secondary | ICD-10-CM | POA: Diagnosis not present

## 2020-11-23 DIAGNOSIS — R262 Difficulty in walking, not elsewhere classified: Secondary | ICD-10-CM | POA: Diagnosis not present

## 2020-11-26 ENCOUNTER — Other Ambulatory Visit: Payer: Self-pay | Admitting: Gastroenterology

## 2020-11-26 DIAGNOSIS — Z1159 Encounter for screening for other viral diseases: Secondary | ICD-10-CM | POA: Diagnosis not present

## 2020-11-27 LAB — SARS CORONAVIRUS 2 (TAT 6-24 HRS): SARS Coronavirus 2: NEGATIVE

## 2020-11-28 ENCOUNTER — Encounter: Payer: Self-pay | Admitting: Gastroenterology

## 2020-11-28 ENCOUNTER — Other Ambulatory Visit: Payer: Self-pay | Admitting: Gastroenterology

## 2020-11-28 ENCOUNTER — Other Ambulatory Visit: Payer: Self-pay

## 2020-11-28 ENCOUNTER — Ambulatory Visit (AMBULATORY_SURGERY_CENTER): Payer: BC Managed Care – PPO | Admitting: Gastroenterology

## 2020-11-28 ENCOUNTER — Encounter: Payer: BC Managed Care – PPO | Admitting: Gastroenterology

## 2020-11-28 VITALS — BP 104/57 | HR 66 | Temp 97.1°F | Resp 12 | Ht 66.0 in | Wt 245.0 lb

## 2020-11-28 DIAGNOSIS — K635 Polyp of colon: Secondary | ICD-10-CM

## 2020-11-28 DIAGNOSIS — Z1211 Encounter for screening for malignant neoplasm of colon: Secondary | ICD-10-CM | POA: Diagnosis not present

## 2020-11-28 DIAGNOSIS — K6289 Other specified diseases of anus and rectum: Secondary | ICD-10-CM

## 2020-11-28 DIAGNOSIS — D120 Benign neoplasm of cecum: Secondary | ICD-10-CM

## 2020-11-28 MED ORDER — SODIUM CHLORIDE 0.9 % IV SOLN
500.0000 mL | INTRAVENOUS | Status: DC
Start: 2020-11-28 — End: 2020-11-28

## 2020-11-28 MED ORDER — SODIUM CHLORIDE 0.9 % IV SOLN: 500.0000 mL | INTRAVENOUS | Status: DC

## 2020-11-28 NOTE — Patient Instructions (Signed)
Thank you for allowing [place] to care for you today!  Await biopsy results approximately 7-10 days.  Will contact you to discuss results and recommendation for next colonoscopy.  Resume previous diet and mediations today.  Return to your normal activities tomorrow.   YOU HAD AN ENDOSCOPIC PROCEDURE TODAY AT [facility]:   Refer to the procedure report that was given to you for any specific questions about what was found during the examination.  If the procedure report does not answer your questions, please call your gastroenterologist to clarify.  If you requested that your care partner not be given the details of your procedure findings, then the procedure report has been included in a sealed envelope for you to review at your convenience later.  YOU SHOULD EXPECT: Some feelings of bloating in the abdomen. Passage of more gas than usual.  Walking can help get rid of the air that was put into your GI tract during the procedure and reduce the bloating. If you had a lower endoscopy (such as a colonoscopy or flexible sigmoidoscopy) you may notice spotting of blood in your stool or on the toilet paper. If you underwent a bowel prep for your procedure, you may not have a normal bowel movement for a few days.  Please Note:  You might notice some irritation and congestion in your nose or some drainage.  This is from the oxygen used during your procedure.  There is no need for concern and it should clear up in a day or so.  SYMPTOMS TO REPORT IMMEDIATELY:   Following lower endoscopy (colonoscopy or flexible sigmoidoscopy):  Excessive amounts of blood in the stool  Significant tenderness or worsening of abdominal pains  Swelling of the abdomen that is new, acute  Fever of 100F or higher    For urgent or emergent issues, a gastroenterologist can be reached at any hour by calling [phone]. Do not use MyChart messaging for urgent concerns.    DIET:  We do recommend a small meal at  first, but then you may proceed to your regular diet.  Drink plenty of fluids but you should avoid alcoholic beverages for 24 hours.  ACTIVITY:  You should plan to take it easy for the rest of today and you should NOT DRIVE or use heavy machinery until tomorrow (because of the sedation medicines used during the test).    FOLLOW UP: Our staff will call the number listed on your records 48-72 hours following your procedure to check on you and address any questions or concerns that you may have regarding the information given to you following your procedure. If we do not reach you, we will leave a message.  We will attempt to reach you two times.  During this call, we will ask if you have developed any symptoms of COVID 19. If you develop any symptoms (ie: fever, flu-like symptoms, shortness of breath, cough etc.) before then, please call [phone].  If you test positive for Covid 19 in the 2 weeks post procedure, please call and report this information to [place].    If any biopsies were taken you will be contacted by phone or by letter within the next 1-3 weeks.  Please call us at [phone] if you have not heard about the biopsies in 3 weeks.    SIGNATURES/CONFIDENTIALITY: You and/or your care partner have signed paperwork which will be entered into your electronic medical record.  These signatures attest to the fact that that the information  above on your After Visit Summary has been reviewed and is understood.  Full responsibility of the confidentiality of this discharge information lies with you and/or your care-partner. 

## 2020-11-28 NOTE — Op Note (Signed)
[place] Patient Name: Jenna Pierce Procedure Date: 11/28/2020 9:39 AM MRN: [id number] Endoscopist: Remo Lipps P. Havery Moros , MD Age: 55 Referring MD:  Date of Birth: 1966-08-03 Gender: Female Account #: [id number] Procedure:                Colonoscopy Indications:              Screening for colorectal malignant neoplasm, This                            is the patient's first colonoscopy Medicines:                Monitored Anesthesia Care Procedure:                Pre-Anesthesia Assessment:                           - Prior to the procedure, a History and Physical                            was performed, and patient medications and                            allergies were reviewed. The patient's tolerance of                            previous anesthesia was also reviewed. The risks                            and benefits of the procedure and the sedation                            options and risks were discussed with the patient.                            All questions were answered, and informed consent                            was obtained. Prior Anticoagulants: The patient has                            taken no previous anticoagulant or antiplatelet                            agents. ASA Grade Assessment: II - A patient with                            mild systemic disease. After reviewing the risks                            and benefits, the patient was deemed in                            satisfactory condition to undergo the procedure.  After obtaining informed consent, the colonoscope                            was passed under direct vision. Throughout the                            procedure, the patient's blood pressure, pulse, and                            oxygen saturations were monitored continuously. The                            Olympus [id number]-[id number]) Colonoscope was                            introduced through the  anus and advanced to the the                            cecum, identified by appendiceal orifice and                            ileocecal valve. The colonoscopy was performed                            without difficulty. The patient tolerated the                            procedure well. The quality of the bowel                            preparation was good. The ileocecal valve,                            appendiceal orifice, and rectum were photographed. Scope In: 9:45:00 AM Scope Out: 10:05:19 AM Scope Withdrawal Time: 0 hours 18 minutes 9 seconds  Total Procedure Duration: 0 hours 20 minutes 19 seconds  Findings:                 The perianal and digital rectal examinations were                            normal.                           A 3 mm polyp was found in the cecum. The polyp was                            sessile. The polyp was removed with a cold snare.                            Resection and retrieval were complete.                           A few small-mouthed diverticula were found in the  sigmoid colon.                           Anal papilla(e) was hypertrophied. Biopsies were                            taken with a cold forceps for histology to rule out                            AIN.                           The exam was otherwise without abnormality. Complications:            No immediate complications. Estimated blood loss:                            Minimal. Estimated Blood Loss:     Estimated blood loss was minimal. Impression:               - One 3 mm polyp in the cecum, removed with a cold                            snare. Resected and retrieved.                           - Diverticulosis in the sigmoid colon.                           - Anal papilla(e) was hypertrophied. Biopsied to                            rule out AIN.                           - The examination was otherwise normal. Recommendation:           - Patient has a  contact number available for                            emergencies. The signs and symptoms of potential                            delayed complications were discussed with the                            patient. Return to normal activities tomorrow.                            Written discharge instructions were provided to the                            patient.                           - Resume previous diet.                           -  Continue present medications.                           - Await pathology results. Remo Lipps P. Balinda Heacock, MD 11/28/2020 10:09:09 AM This report has been signed electronically.

## 2020-11-28 NOTE — Progress Notes (Signed)
Vs CW  Pt's states no medical or surgical changes since previsit or office visit.  

## 2020-11-28 NOTE — Progress Notes (Signed)
Called to room to assist during endoscopic procedure.  Patient ID and intended procedure confirmed with present staff. Received instructions for my participation in the procedure from the performing physician.  

## 2020-11-28 NOTE — Progress Notes (Signed)
Report given to PACU, vss 

## 2020-11-30 ENCOUNTER — Telehealth: Payer: Self-pay

## 2020-11-30 DIAGNOSIS — M25652 Stiffness of left hip, not elsewhere classified: Secondary | ICD-10-CM | POA: Diagnosis not present

## 2020-11-30 DIAGNOSIS — M25572 Pain in left ankle and joints of left foot: Secondary | ICD-10-CM | POA: Diagnosis not present

## 2020-11-30 DIAGNOSIS — R262 Difficulty in walking, not elsewhere classified: Secondary | ICD-10-CM | POA: Diagnosis not present

## 2020-11-30 DIAGNOSIS — M25651 Stiffness of right hip, not elsewhere classified: Secondary | ICD-10-CM | POA: Diagnosis not present

## 2020-11-30 NOTE — Telephone Encounter (Signed)
  Follow up Call-  Call back number 11/28/2020  Post procedure Call Back phone  # [phone]  Permission to leave phone message Yes  Some recent data might be hidden     Patient questions:  Do you have a fever, pain , or abdominal swelling? No. Pain Score  0 *  Have you tolerated food without any problems? Yes.    Have you been able to return to your normal activities? Yes.    Do you have any questions about your discharge instructions: Diet   No. Medications  No. Follow up visit  No.  Do you have questions or concerns about your Care? No.  Actions: * If pain score is 4 or above: 1. No action needed, pain <4.Have you developed a fever since your procedure? no  2.   Have you had an respiratory symptoms (SOB or cough) since your procedure? no  3.   Have you tested positive for COVID 19 since your procedure no  4.   Have you had any family members/close contacts diagnosed with the COVID 19 since your procedure?  no   If yes to any of these questions please route to Joylene John, RN and Joella Prince, RN

## 2020-11-30 NOTE — Telephone Encounter (Signed)
Left message on answering machine. 

## 2020-12-06 DIAGNOSIS — M25572 Pain in left ankle and joints of left foot: Secondary | ICD-10-CM | POA: Diagnosis not present

## 2020-12-06 DIAGNOSIS — M25652 Stiffness of left hip, not elsewhere classified: Secondary | ICD-10-CM | POA: Diagnosis not present

## 2020-12-06 DIAGNOSIS — R262 Difficulty in walking, not elsewhere classified: Secondary | ICD-10-CM | POA: Diagnosis not present

## 2020-12-06 DIAGNOSIS — M25651 Stiffness of right hip, not elsewhere classified: Secondary | ICD-10-CM | POA: Diagnosis not present

## 2020-12-18 DIAGNOSIS — R262 Difficulty in walking, not elsewhere classified: Secondary | ICD-10-CM | POA: Diagnosis not present

## 2020-12-18 DIAGNOSIS — M25651 Stiffness of right hip, not elsewhere classified: Secondary | ICD-10-CM | POA: Diagnosis not present

## 2020-12-18 DIAGNOSIS — M25652 Stiffness of left hip, not elsewhere classified: Secondary | ICD-10-CM | POA: Diagnosis not present

## 2020-12-18 DIAGNOSIS — M25572 Pain in left ankle and joints of left foot: Secondary | ICD-10-CM | POA: Diagnosis not present

## 2020-12-20 DIAGNOSIS — M25651 Stiffness of right hip, not elsewhere classified: Secondary | ICD-10-CM | POA: Diagnosis not present

## 2020-12-20 DIAGNOSIS — M25572 Pain in left ankle and joints of left foot: Secondary | ICD-10-CM | POA: Diagnosis not present

## 2020-12-20 DIAGNOSIS — M25652 Stiffness of left hip, not elsewhere classified: Secondary | ICD-10-CM | POA: Diagnosis not present

## 2020-12-20 DIAGNOSIS — R262 Difficulty in walking, not elsewhere classified: Secondary | ICD-10-CM | POA: Diagnosis not present

## 2020-12-24 ENCOUNTER — Ambulatory Visit: Payer: BC Managed Care – PPO | Admitting: Podiatry

## 2020-12-25 DIAGNOSIS — M25651 Stiffness of right hip, not elsewhere classified: Secondary | ICD-10-CM | POA: Diagnosis not present

## 2020-12-25 DIAGNOSIS — R262 Difficulty in walking, not elsewhere classified: Secondary | ICD-10-CM | POA: Diagnosis not present

## 2020-12-25 DIAGNOSIS — M25652 Stiffness of left hip, not elsewhere classified: Secondary | ICD-10-CM | POA: Diagnosis not present

## 2020-12-25 DIAGNOSIS — M25572 Pain in left ankle and joints of left foot: Secondary | ICD-10-CM | POA: Diagnosis not present

## 2020-12-27 DIAGNOSIS — M25651 Stiffness of right hip, not elsewhere classified: Secondary | ICD-10-CM | POA: Diagnosis not present

## 2020-12-27 DIAGNOSIS — R262 Difficulty in walking, not elsewhere classified: Secondary | ICD-10-CM | POA: Diagnosis not present

## 2020-12-27 DIAGNOSIS — M25652 Stiffness of left hip, not elsewhere classified: Secondary | ICD-10-CM | POA: Diagnosis not present

## 2020-12-27 DIAGNOSIS — M25572 Pain in left ankle and joints of left foot: Secondary | ICD-10-CM | POA: Diagnosis not present

## 2021-01-01 DIAGNOSIS — M25651 Stiffness of right hip, not elsewhere classified: Secondary | ICD-10-CM | POA: Diagnosis not present

## 2021-01-01 DIAGNOSIS — M25652 Stiffness of left hip, not elsewhere classified: Secondary | ICD-10-CM | POA: Diagnosis not present

## 2021-01-01 DIAGNOSIS — M25572 Pain in left ankle and joints of left foot: Secondary | ICD-10-CM | POA: Diagnosis not present

## 2021-01-01 DIAGNOSIS — R262 Difficulty in walking, not elsewhere classified: Secondary | ICD-10-CM | POA: Diagnosis not present

## 2021-01-04 DIAGNOSIS — R262 Difficulty in walking, not elsewhere classified: Secondary | ICD-10-CM | POA: Diagnosis not present

## 2021-01-04 DIAGNOSIS — M25651 Stiffness of right hip, not elsewhere classified: Secondary | ICD-10-CM | POA: Diagnosis not present

## 2021-01-04 DIAGNOSIS — M25572 Pain in left ankle and joints of left foot: Secondary | ICD-10-CM | POA: Diagnosis not present

## 2021-01-04 DIAGNOSIS — M25652 Stiffness of left hip, not elsewhere classified: Secondary | ICD-10-CM | POA: Diagnosis not present

## 2021-01-08 DIAGNOSIS — R262 Difficulty in walking, not elsewhere classified: Secondary | ICD-10-CM | POA: Diagnosis not present

## 2021-01-08 DIAGNOSIS — M25651 Stiffness of right hip, not elsewhere classified: Secondary | ICD-10-CM | POA: Diagnosis not present

## 2021-01-08 DIAGNOSIS — M25572 Pain in left ankle and joints of left foot: Secondary | ICD-10-CM | POA: Diagnosis not present

## 2021-01-08 DIAGNOSIS — M25652 Stiffness of left hip, not elsewhere classified: Secondary | ICD-10-CM | POA: Diagnosis not present

## 2021-01-10 DIAGNOSIS — M25651 Stiffness of right hip, not elsewhere classified: Secondary | ICD-10-CM | POA: Diagnosis not present

## 2021-01-10 DIAGNOSIS — M25572 Pain in left ankle and joints of left foot: Secondary | ICD-10-CM | POA: Diagnosis not present

## 2021-01-10 DIAGNOSIS — R262 Difficulty in walking, not elsewhere classified: Secondary | ICD-10-CM | POA: Diagnosis not present

## 2021-01-10 DIAGNOSIS — M25652 Stiffness of left hip, not elsewhere classified: Secondary | ICD-10-CM | POA: Diagnosis not present

## 2021-01-15 DIAGNOSIS — M25651 Stiffness of right hip, not elsewhere classified: Secondary | ICD-10-CM | POA: Diagnosis not present

## 2021-01-15 DIAGNOSIS — R262 Difficulty in walking, not elsewhere classified: Secondary | ICD-10-CM | POA: Diagnosis not present

## 2021-01-15 DIAGNOSIS — M25652 Stiffness of left hip, not elsewhere classified: Secondary | ICD-10-CM | POA: Diagnosis not present

## 2021-01-15 DIAGNOSIS — M25572 Pain in left ankle and joints of left foot: Secondary | ICD-10-CM | POA: Diagnosis not present

## 2021-01-17 DIAGNOSIS — R262 Difficulty in walking, not elsewhere classified: Secondary | ICD-10-CM | POA: Diagnosis not present

## 2021-01-17 DIAGNOSIS — M25651 Stiffness of right hip, not elsewhere classified: Secondary | ICD-10-CM | POA: Diagnosis not present

## 2021-01-17 DIAGNOSIS — M25652 Stiffness of left hip, not elsewhere classified: Secondary | ICD-10-CM | POA: Diagnosis not present

## 2021-01-17 DIAGNOSIS — M25572 Pain in left ankle and joints of left foot: Secondary | ICD-10-CM | POA: Diagnosis not present

## 2021-01-24 DIAGNOSIS — R262 Difficulty in walking, not elsewhere classified: Secondary | ICD-10-CM | POA: Diagnosis not present

## 2021-01-24 DIAGNOSIS — M25652 Stiffness of left hip, not elsewhere classified: Secondary | ICD-10-CM | POA: Diagnosis not present

## 2021-01-24 DIAGNOSIS — M25572 Pain in left ankle and joints of left foot: Secondary | ICD-10-CM | POA: Diagnosis not present

## 2021-01-24 DIAGNOSIS — M25651 Stiffness of right hip, not elsewhere classified: Secondary | ICD-10-CM | POA: Diagnosis not present

## 2021-01-29 DIAGNOSIS — M25651 Stiffness of right hip, not elsewhere classified: Secondary | ICD-10-CM | POA: Diagnosis not present

## 2021-01-29 DIAGNOSIS — M25652 Stiffness of left hip, not elsewhere classified: Secondary | ICD-10-CM | POA: Diagnosis not present

## 2021-01-29 DIAGNOSIS — M25572 Pain in left ankle and joints of left foot: Secondary | ICD-10-CM | POA: Diagnosis not present

## 2021-01-29 DIAGNOSIS — R262 Difficulty in walking, not elsewhere classified: Secondary | ICD-10-CM | POA: Diagnosis not present

## 2021-01-31 DIAGNOSIS — M25572 Pain in left ankle and joints of left foot: Secondary | ICD-10-CM | POA: Diagnosis not present

## 2021-01-31 DIAGNOSIS — M25652 Stiffness of left hip, not elsewhere classified: Secondary | ICD-10-CM | POA: Diagnosis not present

## 2021-01-31 DIAGNOSIS — R262 Difficulty in walking, not elsewhere classified: Secondary | ICD-10-CM | POA: Diagnosis not present

## 2021-01-31 DIAGNOSIS — M25651 Stiffness of right hip, not elsewhere classified: Secondary | ICD-10-CM | POA: Diagnosis not present

## 2021-02-05 DIAGNOSIS — R262 Difficulty in walking, not elsewhere classified: Secondary | ICD-10-CM | POA: Diagnosis not present

## 2021-02-05 DIAGNOSIS — M25651 Stiffness of right hip, not elsewhere classified: Secondary | ICD-10-CM | POA: Diagnosis not present

## 2021-02-05 DIAGNOSIS — M25652 Stiffness of left hip, not elsewhere classified: Secondary | ICD-10-CM | POA: Diagnosis not present

## 2021-02-05 DIAGNOSIS — M25572 Pain in left ankle and joints of left foot: Secondary | ICD-10-CM | POA: Diagnosis not present

## 2021-02-12 DIAGNOSIS — M25652 Stiffness of left hip, not elsewhere classified: Secondary | ICD-10-CM | POA: Diagnosis not present

## 2021-02-12 DIAGNOSIS — R262 Difficulty in walking, not elsewhere classified: Secondary | ICD-10-CM | POA: Diagnosis not present

## 2021-02-12 DIAGNOSIS — M25572 Pain in left ankle and joints of left foot: Secondary | ICD-10-CM | POA: Diagnosis not present

## 2021-02-12 DIAGNOSIS — M25651 Stiffness of right hip, not elsewhere classified: Secondary | ICD-10-CM | POA: Diagnosis not present

## 2021-02-19 DIAGNOSIS — M25572 Pain in left ankle and joints of left foot: Secondary | ICD-10-CM | POA: Diagnosis not present

## 2021-02-19 DIAGNOSIS — M25651 Stiffness of right hip, not elsewhere classified: Secondary | ICD-10-CM | POA: Diagnosis not present

## 2021-02-19 DIAGNOSIS — M25652 Stiffness of left hip, not elsewhere classified: Secondary | ICD-10-CM | POA: Diagnosis not present

## 2021-02-19 DIAGNOSIS — R262 Difficulty in walking, not elsewhere classified: Secondary | ICD-10-CM | POA: Diagnosis not present

## 2021-02-22 DIAGNOSIS — M25651 Stiffness of right hip, not elsewhere classified: Secondary | ICD-10-CM | POA: Diagnosis not present

## 2021-02-22 DIAGNOSIS — R262 Difficulty in walking, not elsewhere classified: Secondary | ICD-10-CM | POA: Diagnosis not present

## 2021-02-22 DIAGNOSIS — M25572 Pain in left ankle and joints of left foot: Secondary | ICD-10-CM | POA: Diagnosis not present

## 2021-02-22 DIAGNOSIS — M25652 Stiffness of left hip, not elsewhere classified: Secondary | ICD-10-CM | POA: Diagnosis not present

## 2021-02-25 DIAGNOSIS — M25651 Stiffness of right hip, not elsewhere classified: Secondary | ICD-10-CM | POA: Diagnosis not present

## 2021-02-25 DIAGNOSIS — M25652 Stiffness of left hip, not elsewhere classified: Secondary | ICD-10-CM | POA: Diagnosis not present

## 2021-02-25 DIAGNOSIS — M25572 Pain in left ankle and joints of left foot: Secondary | ICD-10-CM | POA: Diagnosis not present

## 2021-02-25 DIAGNOSIS — R262 Difficulty in walking, not elsewhere classified: Secondary | ICD-10-CM | POA: Diagnosis not present

## 2021-02-27 DIAGNOSIS — M25651 Stiffness of right hip, not elsewhere classified: Secondary | ICD-10-CM | POA: Diagnosis not present

## 2021-02-27 DIAGNOSIS — R262 Difficulty in walking, not elsewhere classified: Secondary | ICD-10-CM | POA: Diagnosis not present

## 2021-02-27 DIAGNOSIS — M25572 Pain in left ankle and joints of left foot: Secondary | ICD-10-CM | POA: Diagnosis not present

## 2021-02-27 DIAGNOSIS — M25652 Stiffness of left hip, not elsewhere classified: Secondary | ICD-10-CM | POA: Diagnosis not present

## 2021-03-13 DIAGNOSIS — M25572 Pain in left ankle and joints of left foot: Secondary | ICD-10-CM | POA: Diagnosis not present

## 2021-03-13 DIAGNOSIS — M25652 Stiffness of left hip, not elsewhere classified: Secondary | ICD-10-CM | POA: Diagnosis not present

## 2021-03-13 DIAGNOSIS — R262 Difficulty in walking, not elsewhere classified: Secondary | ICD-10-CM | POA: Diagnosis not present

## 2021-03-13 DIAGNOSIS — M25651 Stiffness of right hip, not elsewhere classified: Secondary | ICD-10-CM | POA: Diagnosis not present

## 2021-03-15 DIAGNOSIS — M25572 Pain in left ankle and joints of left foot: Secondary | ICD-10-CM | POA: Diagnosis not present

## 2021-03-15 DIAGNOSIS — R262 Difficulty in walking, not elsewhere classified: Secondary | ICD-10-CM | POA: Diagnosis not present

## 2021-03-15 DIAGNOSIS — M25652 Stiffness of left hip, not elsewhere classified: Secondary | ICD-10-CM | POA: Diagnosis not present

## 2021-03-15 DIAGNOSIS — M25651 Stiffness of right hip, not elsewhere classified: Secondary | ICD-10-CM | POA: Diagnosis not present

## 2021-03-19 DIAGNOSIS — R262 Difficulty in walking, not elsewhere classified: Secondary | ICD-10-CM | POA: Diagnosis not present

## 2021-03-19 DIAGNOSIS — M25572 Pain in left ankle and joints of left foot: Secondary | ICD-10-CM | POA: Diagnosis not present

## 2021-03-19 DIAGNOSIS — M25652 Stiffness of left hip, not elsewhere classified: Secondary | ICD-10-CM | POA: Diagnosis not present

## 2021-03-19 DIAGNOSIS — M25651 Stiffness of right hip, not elsewhere classified: Secondary | ICD-10-CM | POA: Diagnosis not present

## 2021-03-21 DIAGNOSIS — R262 Difficulty in walking, not elsewhere classified: Secondary | ICD-10-CM | POA: Diagnosis not present

## 2021-03-21 DIAGNOSIS — M25651 Stiffness of right hip, not elsewhere classified: Secondary | ICD-10-CM | POA: Diagnosis not present

## 2021-03-21 DIAGNOSIS — M25652 Stiffness of left hip, not elsewhere classified: Secondary | ICD-10-CM | POA: Diagnosis not present

## 2021-03-21 DIAGNOSIS — M25572 Pain in left ankle and joints of left foot: Secondary | ICD-10-CM | POA: Diagnosis not present

## 2021-03-25 DIAGNOSIS — M25651 Stiffness of right hip, not elsewhere classified: Secondary | ICD-10-CM | POA: Diagnosis not present

## 2021-03-25 DIAGNOSIS — M25572 Pain in left ankle and joints of left foot: Secondary | ICD-10-CM | POA: Diagnosis not present

## 2021-03-25 DIAGNOSIS — M25652 Stiffness of left hip, not elsewhere classified: Secondary | ICD-10-CM | POA: Diagnosis not present

## 2021-03-25 DIAGNOSIS — R262 Difficulty in walking, not elsewhere classified: Secondary | ICD-10-CM | POA: Diagnosis not present

## 2021-03-27 DIAGNOSIS — M25651 Stiffness of right hip, not elsewhere classified: Secondary | ICD-10-CM | POA: Diagnosis not present

## 2021-03-27 DIAGNOSIS — M25572 Pain in left ankle and joints of left foot: Secondary | ICD-10-CM | POA: Diagnosis not present

## 2021-03-27 DIAGNOSIS — M25652 Stiffness of left hip, not elsewhere classified: Secondary | ICD-10-CM | POA: Diagnosis not present

## 2021-03-27 DIAGNOSIS — R262 Difficulty in walking, not elsewhere classified: Secondary | ICD-10-CM | POA: Diagnosis not present

## 2021-04-01 DIAGNOSIS — R262 Difficulty in walking, not elsewhere classified: Secondary | ICD-10-CM | POA: Diagnosis not present

## 2021-04-01 DIAGNOSIS — M25572 Pain in left ankle and joints of left foot: Secondary | ICD-10-CM | POA: Diagnosis not present

## 2021-04-01 DIAGNOSIS — M25651 Stiffness of right hip, not elsewhere classified: Secondary | ICD-10-CM | POA: Diagnosis not present

## 2021-04-01 DIAGNOSIS — M25652 Stiffness of left hip, not elsewhere classified: Secondary | ICD-10-CM | POA: Diagnosis not present

## 2021-04-05 DIAGNOSIS — R262 Difficulty in walking, not elsewhere classified: Secondary | ICD-10-CM | POA: Diagnosis not present

## 2021-04-05 DIAGNOSIS — M25651 Stiffness of right hip, not elsewhere classified: Secondary | ICD-10-CM | POA: Diagnosis not present

## 2021-04-05 DIAGNOSIS — M25652 Stiffness of left hip, not elsewhere classified: Secondary | ICD-10-CM | POA: Diagnosis not present

## 2021-04-05 DIAGNOSIS — M25572 Pain in left ankle and joints of left foot: Secondary | ICD-10-CM | POA: Diagnosis not present

## 2021-04-16 DIAGNOSIS — M25572 Pain in left ankle and joints of left foot: Secondary | ICD-10-CM | POA: Diagnosis not present

## 2021-04-16 DIAGNOSIS — M25652 Stiffness of left hip, not elsewhere classified: Secondary | ICD-10-CM | POA: Diagnosis not present

## 2021-04-16 DIAGNOSIS — M25651 Stiffness of right hip, not elsewhere classified: Secondary | ICD-10-CM | POA: Diagnosis not present

## 2021-04-16 DIAGNOSIS — R262 Difficulty in walking, not elsewhere classified: Secondary | ICD-10-CM | POA: Diagnosis not present

## 2021-04-18 DIAGNOSIS — M25652 Stiffness of left hip, not elsewhere classified: Secondary | ICD-10-CM | POA: Diagnosis not present

## 2021-04-18 DIAGNOSIS — M25651 Stiffness of right hip, not elsewhere classified: Secondary | ICD-10-CM | POA: Diagnosis not present

## 2021-04-18 DIAGNOSIS — R262 Difficulty in walking, not elsewhere classified: Secondary | ICD-10-CM | POA: Diagnosis not present

## 2021-04-18 DIAGNOSIS — M25572 Pain in left ankle and joints of left foot: Secondary | ICD-10-CM | POA: Diagnosis not present

## 2021-04-24 DIAGNOSIS — R262 Difficulty in walking, not elsewhere classified: Secondary | ICD-10-CM | POA: Diagnosis not present

## 2021-04-24 DIAGNOSIS — M25572 Pain in left ankle and joints of left foot: Secondary | ICD-10-CM | POA: Diagnosis not present

## 2021-04-24 DIAGNOSIS — M25651 Stiffness of right hip, not elsewhere classified: Secondary | ICD-10-CM | POA: Diagnosis not present

## 2021-04-24 DIAGNOSIS — M25652 Stiffness of left hip, not elsewhere classified: Secondary | ICD-10-CM | POA: Diagnosis not present

## 2021-05-01 DIAGNOSIS — M25651 Stiffness of right hip, not elsewhere classified: Secondary | ICD-10-CM | POA: Diagnosis not present

## 2021-05-01 DIAGNOSIS — R262 Difficulty in walking, not elsewhere classified: Secondary | ICD-10-CM | POA: Diagnosis not present

## 2021-05-01 DIAGNOSIS — M25572 Pain in left ankle and joints of left foot: Secondary | ICD-10-CM | POA: Diagnosis not present

## 2021-05-01 DIAGNOSIS — M25652 Stiffness of left hip, not elsewhere classified: Secondary | ICD-10-CM | POA: Diagnosis not present

## 2021-05-10 DIAGNOSIS — M25651 Stiffness of right hip, not elsewhere classified: Secondary | ICD-10-CM | POA: Diagnosis not present

## 2021-05-10 DIAGNOSIS — M25572 Pain in left ankle and joints of left foot: Secondary | ICD-10-CM | POA: Diagnosis not present

## 2021-05-10 DIAGNOSIS — M25652 Stiffness of left hip, not elsewhere classified: Secondary | ICD-10-CM | POA: Diagnosis not present

## 2021-05-10 DIAGNOSIS — R262 Difficulty in walking, not elsewhere classified: Secondary | ICD-10-CM | POA: Diagnosis not present

## 2021-09-10 ENCOUNTER — Other Ambulatory Visit: Payer: Self-pay | Admitting: Physician Assistant

## 2021-12-14 DIAGNOSIS — W1839XA Other fall on same level, initial encounter: Secondary | ICD-10-CM | POA: Diagnosis not present

## 2021-12-14 DIAGNOSIS — S60221A Contusion of right hand, initial encounter: Secondary | ICD-10-CM | POA: Diagnosis not present

## 2021-12-21 DIAGNOSIS — M79641 Pain in right hand: Secondary | ICD-10-CM | POA: Diagnosis not present

## 2022-01-09 ENCOUNTER — Other Ambulatory Visit: Payer: Self-pay | Admitting: Physician Assistant

## 2022-02-01 ENCOUNTER — Other Ambulatory Visit: Payer: Self-pay | Admitting: Physician Assistant

## 2022-04-01 DIAGNOSIS — Z6833 Body mass index (BMI) 33.0-33.9, adult: Secondary | ICD-10-CM | POA: Diagnosis not present

## 2022-04-01 DIAGNOSIS — Z1151 Encounter for screening for human papillomavirus (HPV): Secondary | ICD-10-CM | POA: Diagnosis not present

## 2022-04-01 DIAGNOSIS — Z13228 Encounter for screening for other metabolic disorders: Secondary | ICD-10-CM | POA: Diagnosis not present

## 2022-04-01 DIAGNOSIS — Z13220 Encounter for screening for lipoid disorders: Secondary | ICD-10-CM | POA: Diagnosis not present

## 2022-04-01 DIAGNOSIS — Z1321 Encounter for screening for nutritional disorder: Secondary | ICD-10-CM | POA: Diagnosis not present

## 2022-04-01 DIAGNOSIS — Z1231 Encounter for screening mammogram for malignant neoplasm of breast: Secondary | ICD-10-CM | POA: Diagnosis not present

## 2022-04-01 DIAGNOSIS — Z131 Encounter for screening for diabetes mellitus: Secondary | ICD-10-CM | POA: Diagnosis not present

## 2022-04-01 DIAGNOSIS — Z01419 Encounter for gynecological examination (general) (routine) without abnormal findings: Secondary | ICD-10-CM | POA: Diagnosis not present

## 2022-04-01 DIAGNOSIS — Z130 Encounter for screening for diseases of the blood and blood-forming organs and certain disorders involving the immune mechanism: Secondary | ICD-10-CM | POA: Diagnosis not present

## 2022-04-01 DIAGNOSIS — Z1272 Encounter for screening for malignant neoplasm of vagina: Secondary | ICD-10-CM | POA: Diagnosis not present

## 2022-04-02 ENCOUNTER — Other Ambulatory Visit: Payer: Self-pay | Admitting: Family Medicine

## 2022-04-02 ENCOUNTER — Other Ambulatory Visit: Payer: Self-pay | Admitting: Obstetrics and Gynecology

## 2022-04-02 DIAGNOSIS — R928 Other abnormal and inconclusive findings on diagnostic imaging of breast: Secondary | ICD-10-CM

## 2022-04-17 ENCOUNTER — Other Ambulatory Visit: Payer: BC Managed Care – PPO

## 2022-04-18 ENCOUNTER — Other Ambulatory Visit: Payer: Self-pay | Admitting: Obstetrics and Gynecology

## 2022-04-18 ENCOUNTER — Ambulatory Visit
Admission: RE | Admit: 2022-04-18 | Discharge: 2022-04-18 | Disposition: A | Discharge status: Home / Self Care | Payer: BC Managed Care – PPO | Source: Ambulatory Visit | Attending: Obstetrics and Gynecology | Admitting: Obstetrics and Gynecology

## 2022-04-18 DIAGNOSIS — R921 Mammographic calcification found on diagnostic imaging of breast: Secondary | ICD-10-CM | POA: Diagnosis not present

## 2022-04-18 DIAGNOSIS — R599 Enlarged lymph nodes, unspecified: Secondary | ICD-10-CM

## 2022-04-18 DIAGNOSIS — N6320 Unspecified lump in the left breast, unspecified quadrant: Secondary | ICD-10-CM

## 2022-04-18 DIAGNOSIS — R928 Other abnormal and inconclusive findings on diagnostic imaging of breast: Secondary | ICD-10-CM

## 2022-04-22 ENCOUNTER — Other Ambulatory Visit: Payer: Self-pay | Admitting: Obstetrics and Gynecology

## 2022-04-22 ENCOUNTER — Ambulatory Visit
Admission: RE | Admit: 2022-04-22 | Discharge: 2022-04-22 | Disposition: A | Discharge status: Home / Self Care | Payer: BC Managed Care – PPO | Source: Ambulatory Visit | Attending: Obstetrics and Gynecology | Admitting: Obstetrics and Gynecology

## 2022-04-22 DIAGNOSIS — R599 Enlarged lymph nodes, unspecified: Secondary | ICD-10-CM

## 2022-04-22 DIAGNOSIS — N6320 Unspecified lump in the left breast, unspecified quadrant: Secondary | ICD-10-CM

## 2022-04-22 DIAGNOSIS — N6012 Diffuse cystic mastopathy of left breast: Secondary | ICD-10-CM | POA: Diagnosis not present

## 2022-04-22 DIAGNOSIS — N6321 Unspecified lump in the left breast, upper outer quadrant: Secondary | ICD-10-CM | POA: Diagnosis not present

## 2022-04-22 DIAGNOSIS — C50412 Malignant neoplasm of upper-outer quadrant of left female breast: Secondary | ICD-10-CM | POA: Diagnosis not present

## 2022-05-01 DIAGNOSIS — Z803 Family history of malignant neoplasm of breast: Secondary | ICD-10-CM | POA: Diagnosis not present

## 2022-05-01 DIAGNOSIS — F418 Other specified anxiety disorders: Secondary | ICD-10-CM | POA: Diagnosis not present

## 2022-05-01 DIAGNOSIS — C50412 Malignant neoplasm of upper-outer quadrant of left female breast: Secondary | ICD-10-CM | POA: Diagnosis not present

## 2022-05-01 DIAGNOSIS — R928 Other abnormal and inconclusive findings on diagnostic imaging of breast: Secondary | ICD-10-CM | POA: Diagnosis not present

## 2022-05-02 DIAGNOSIS — G4733 Obstructive sleep apnea (adult) (pediatric): Secondary | ICD-10-CM | POA: Diagnosis not present

## 2022-05-02 DIAGNOSIS — Z6833 Body mass index (BMI) 33.0-33.9, adult: Secondary | ICD-10-CM | POA: Diagnosis not present

## 2022-05-02 DIAGNOSIS — Z170 Estrogen receptor positive status [ER+]: Secondary | ICD-10-CM | POA: Diagnosis not present

## 2022-05-02 DIAGNOSIS — N3941 Urge incontinence: Secondary | ICD-10-CM | POA: Diagnosis not present

## 2022-05-02 DIAGNOSIS — E039 Hypothyroidism, unspecified: Secondary | ICD-10-CM | POA: Diagnosis not present

## 2022-05-02 DIAGNOSIS — C50412 Malignant neoplasm of upper-outer quadrant of left female breast: Secondary | ICD-10-CM | POA: Diagnosis not present

## 2022-05-02 DIAGNOSIS — F419 Anxiety disorder, unspecified: Secondary | ICD-10-CM | POA: Diagnosis not present

## 2022-05-02 DIAGNOSIS — E8881 Metabolic syndrome: Secondary | ICD-10-CM | POA: Diagnosis not present

## 2022-05-02 DIAGNOSIS — E538 Deficiency of other specified B group vitamins: Secondary | ICD-10-CM | POA: Diagnosis not present

## 2022-05-05 DIAGNOSIS — Z5181 Encounter for therapeutic drug level monitoring: Secondary | ICD-10-CM | POA: Diagnosis not present

## 2022-05-05 DIAGNOSIS — Z170 Estrogen receptor positive status [ER+]: Secondary | ICD-10-CM | POA: Diagnosis not present

## 2022-05-05 DIAGNOSIS — C50412 Malignant neoplasm of upper-outer quadrant of left female breast: Secondary | ICD-10-CM | POA: Diagnosis not present

## 2022-05-05 DIAGNOSIS — Z79899 Other long term (current) drug therapy: Secondary | ICD-10-CM | POA: Diagnosis not present

## 2022-05-08 DIAGNOSIS — C50412 Malignant neoplasm of upper-outer quadrant of left female breast: Secondary | ICD-10-CM | POA: Diagnosis not present

## 2022-05-14 DIAGNOSIS — C50412 Malignant neoplasm of upper-outer quadrant of left female breast: Secondary | ICD-10-CM | POA: Diagnosis not present

## 2022-05-14 DIAGNOSIS — Z170 Estrogen receptor positive status [ER+]: Secondary | ICD-10-CM | POA: Diagnosis not present

## 2022-05-14 DIAGNOSIS — Z79899 Other long term (current) drug therapy: Secondary | ICD-10-CM | POA: Diagnosis not present

## 2022-05-15 DIAGNOSIS — C50412 Malignant neoplasm of upper-outer quadrant of left female breast: Secondary | ICD-10-CM | POA: Diagnosis not present

## 2022-05-15 DIAGNOSIS — Z170 Estrogen receptor positive status [ER+]: Secondary | ICD-10-CM | POA: Diagnosis not present

## 2022-05-15 DIAGNOSIS — N289 Disorder of kidney and ureter, unspecified: Secondary | ICD-10-CM | POA: Diagnosis not present

## 2022-05-16 DIAGNOSIS — E538 Deficiency of other specified B group vitamins: Secondary | ICD-10-CM | POA: Diagnosis not present

## 2022-05-16 DIAGNOSIS — E8881 Metabolic syndrome: Secondary | ICD-10-CM | POA: Diagnosis not present

## 2022-05-16 DIAGNOSIS — F419 Anxiety disorder, unspecified: Secondary | ICD-10-CM | POA: Diagnosis not present

## 2022-05-16 DIAGNOSIS — Z5112 Encounter for antineoplastic immunotherapy: Secondary | ICD-10-CM | POA: Diagnosis not present

## 2022-05-16 DIAGNOSIS — Z5111 Encounter for antineoplastic chemotherapy: Secondary | ICD-10-CM | POA: Diagnosis not present

## 2022-05-16 DIAGNOSIS — Z170 Estrogen receptor positive status [ER+]: Secondary | ICD-10-CM | POA: Diagnosis not present

## 2022-05-16 DIAGNOSIS — E039 Hypothyroidism, unspecified: Secondary | ICD-10-CM | POA: Diagnosis not present

## 2022-05-16 DIAGNOSIS — C50412 Malignant neoplasm of upper-outer quadrant of left female breast: Secondary | ICD-10-CM | POA: Diagnosis not present

## 2022-05-16 DIAGNOSIS — Z6834 Body mass index (BMI) 34.0-34.9, adult: Secondary | ICD-10-CM | POA: Diagnosis not present

## 2022-05-16 DIAGNOSIS — G4733 Obstructive sleep apnea (adult) (pediatric): Secondary | ICD-10-CM | POA: Diagnosis not present

## 2022-05-16 DIAGNOSIS — N3941 Urge incontinence: Secondary | ICD-10-CM | POA: Diagnosis not present

## 2022-05-19 DIAGNOSIS — Z6834 Body mass index (BMI) 34.0-34.9, adult: Secondary | ICD-10-CM | POA: Diagnosis not present

## 2022-05-19 DIAGNOSIS — N3941 Urge incontinence: Secondary | ICD-10-CM | POA: Diagnosis not present

## 2022-05-19 DIAGNOSIS — Z170 Estrogen receptor positive status [ER+]: Secondary | ICD-10-CM | POA: Diagnosis not present

## 2022-05-19 DIAGNOSIS — E8881 Metabolic syndrome: Secondary | ICD-10-CM | POA: Diagnosis not present

## 2022-05-19 DIAGNOSIS — C50412 Malignant neoplasm of upper-outer quadrant of left female breast: Secondary | ICD-10-CM | POA: Diagnosis not present

## 2022-05-19 DIAGNOSIS — E039 Hypothyroidism, unspecified: Secondary | ICD-10-CM | POA: Diagnosis not present

## 2022-05-19 DIAGNOSIS — Z5111 Encounter for antineoplastic chemotherapy: Secondary | ICD-10-CM | POA: Diagnosis not present

## 2022-05-19 DIAGNOSIS — E538 Deficiency of other specified B group vitamins: Secondary | ICD-10-CM | POA: Diagnosis not present

## 2022-05-19 DIAGNOSIS — F419 Anxiety disorder, unspecified: Secondary | ICD-10-CM | POA: Diagnosis not present

## 2022-05-19 DIAGNOSIS — Z5112 Encounter for antineoplastic immunotherapy: Secondary | ICD-10-CM | POA: Diagnosis not present

## 2022-05-19 DIAGNOSIS — G4733 Obstructive sleep apnea (adult) (pediatric): Secondary | ICD-10-CM | POA: Diagnosis not present

## 2022-05-22 DIAGNOSIS — C50412 Malignant neoplasm of upper-outer quadrant of left female breast: Secondary | ICD-10-CM | POA: Diagnosis not present

## 2022-05-22 DIAGNOSIS — Z170 Estrogen receptor positive status [ER+]: Secondary | ICD-10-CM | POA: Diagnosis not present

## 2022-05-22 DIAGNOSIS — R937 Abnormal findings on diagnostic imaging of other parts of musculoskeletal system: Secondary | ICD-10-CM | POA: Diagnosis not present

## 2022-05-23 DIAGNOSIS — G44209 Tension-type headache, unspecified, not intractable: Secondary | ICD-10-CM | POA: Diagnosis not present

## 2022-05-23 DIAGNOSIS — C50412 Malignant neoplasm of upper-outer quadrant of left female breast: Secondary | ICD-10-CM | POA: Diagnosis not present

## 2022-05-23 DIAGNOSIS — R519 Headache, unspecified: Secondary | ICD-10-CM | POA: Diagnosis not present

## 2022-05-24 ENCOUNTER — Other Ambulatory Visit: Payer: Self-pay | Admitting: Physician Assistant

## 2022-06-03 DIAGNOSIS — C50412 Malignant neoplasm of upper-outer quadrant of left female breast: Secondary | ICD-10-CM | POA: Diagnosis not present

## 2022-06-03 DIAGNOSIS — Z6833 Body mass index (BMI) 33.0-33.9, adult: Secondary | ICD-10-CM | POA: Diagnosis not present

## 2022-06-03 DIAGNOSIS — R32 Unspecified urinary incontinence: Secondary | ICD-10-CM | POA: Diagnosis not present

## 2022-06-03 DIAGNOSIS — M6258 Muscle wasting and atrophy, not elsewhere classified, other site: Secondary | ICD-10-CM | POA: Diagnosis not present

## 2022-06-03 DIAGNOSIS — R350 Frequency of micturition: Secondary | ICD-10-CM | POA: Diagnosis not present

## 2022-06-03 DIAGNOSIS — E039 Hypothyroidism, unspecified: Secondary | ICD-10-CM | POA: Diagnosis not present

## 2022-06-03 DIAGNOSIS — E538 Deficiency of other specified B group vitamins: Secondary | ICD-10-CM | POA: Diagnosis not present

## 2022-06-03 DIAGNOSIS — F419 Anxiety disorder, unspecified: Secondary | ICD-10-CM | POA: Diagnosis not present

## 2022-06-03 DIAGNOSIS — E8881 Metabolic syndrome: Secondary | ICD-10-CM | POA: Diagnosis not present

## 2022-06-03 DIAGNOSIS — G4733 Obstructive sleep apnea (adult) (pediatric): Secondary | ICD-10-CM | POA: Diagnosis not present

## 2022-06-03 DIAGNOSIS — Z170 Estrogen receptor positive status [ER+]: Secondary | ICD-10-CM | POA: Diagnosis not present

## 2022-06-03 DIAGNOSIS — Z79899 Other long term (current) drug therapy: Secondary | ICD-10-CM | POA: Diagnosis not present

## 2022-06-06 DIAGNOSIS — Z79899 Other long term (current) drug therapy: Secondary | ICD-10-CM | POA: Diagnosis not present

## 2022-06-06 DIAGNOSIS — Z5112 Encounter for antineoplastic immunotherapy: Secondary | ICD-10-CM | POA: Diagnosis not present

## 2022-06-06 DIAGNOSIS — Z170 Estrogen receptor positive status [ER+]: Secondary | ICD-10-CM | POA: Diagnosis not present

## 2022-06-06 DIAGNOSIS — C50412 Malignant neoplasm of upper-outer quadrant of left female breast: Secondary | ICD-10-CM | POA: Diagnosis not present

## 2022-06-06 DIAGNOSIS — Z5111 Encounter for antineoplastic chemotherapy: Secondary | ICD-10-CM | POA: Diagnosis not present

## 2022-06-09 DIAGNOSIS — C50412 Malignant neoplasm of upper-outer quadrant of left female breast: Secondary | ICD-10-CM | POA: Diagnosis not present

## 2022-06-09 DIAGNOSIS — Z170 Estrogen receptor positive status [ER+]: Secondary | ICD-10-CM | POA: Diagnosis not present

## 2022-06-13 ENCOUNTER — Other Ambulatory Visit: Payer: Self-pay | Admitting: Physician Assistant

## 2022-06-23 DIAGNOSIS — C50412 Malignant neoplasm of upper-outer quadrant of left female breast: Secondary | ICD-10-CM | POA: Diagnosis not present

## 2022-06-23 DIAGNOSIS — N289 Disorder of kidney and ureter, unspecified: Secondary | ICD-10-CM | POA: Diagnosis not present

## 2022-06-23 DIAGNOSIS — Z170 Estrogen receptor positive status [ER+]: Secondary | ICD-10-CM | POA: Diagnosis not present

## 2022-06-25 DIAGNOSIS — E8881 Metabolic syndrome: Secondary | ICD-10-CM | POA: Diagnosis not present

## 2022-06-25 DIAGNOSIS — R3915 Urgency of urination: Secondary | ICD-10-CM | POA: Diagnosis not present

## 2022-06-25 DIAGNOSIS — N858 Other specified noninflammatory disorders of uterus: Secondary | ICD-10-CM | POA: Diagnosis not present

## 2022-06-25 DIAGNOSIS — E538 Deficiency of other specified B group vitamins: Secondary | ICD-10-CM | POA: Diagnosis not present

## 2022-06-25 DIAGNOSIS — F419 Anxiety disorder, unspecified: Secondary | ICD-10-CM | POA: Diagnosis not present

## 2022-06-25 DIAGNOSIS — C50412 Malignant neoplasm of upper-outer quadrant of left female breast: Secondary | ICD-10-CM | POA: Diagnosis not present

## 2022-06-25 DIAGNOSIS — G4733 Obstructive sleep apnea (adult) (pediatric): Secondary | ICD-10-CM | POA: Diagnosis not present

## 2022-06-25 DIAGNOSIS — Z6833 Body mass index (BMI) 33.0-33.9, adult: Secondary | ICD-10-CM | POA: Diagnosis not present

## 2022-06-25 DIAGNOSIS — E039 Hypothyroidism, unspecified: Secondary | ICD-10-CM | POA: Diagnosis not present

## 2022-06-25 DIAGNOSIS — Z170 Estrogen receptor positive status [ER+]: Secondary | ICD-10-CM | POA: Diagnosis not present

## 2022-06-27 DIAGNOSIS — Z79899 Other long term (current) drug therapy: Secondary | ICD-10-CM | POA: Diagnosis not present

## 2022-06-27 DIAGNOSIS — C50412 Malignant neoplasm of upper-outer quadrant of left female breast: Secondary | ICD-10-CM | POA: Diagnosis not present

## 2022-06-27 DIAGNOSIS — Z5112 Encounter for antineoplastic immunotherapy: Secondary | ICD-10-CM | POA: Diagnosis not present

## 2022-06-27 DIAGNOSIS — Z170 Estrogen receptor positive status [ER+]: Secondary | ICD-10-CM | POA: Diagnosis not present

## 2022-06-27 DIAGNOSIS — Z5111 Encounter for antineoplastic chemotherapy: Secondary | ICD-10-CM | POA: Diagnosis not present

## 2022-06-30 DIAGNOSIS — Z79899 Other long term (current) drug therapy: Secondary | ICD-10-CM | POA: Diagnosis not present

## 2022-06-30 DIAGNOSIS — C50412 Malignant neoplasm of upper-outer quadrant of left female breast: Secondary | ICD-10-CM | POA: Diagnosis not present

## 2022-06-30 DIAGNOSIS — Z5112 Encounter for antineoplastic immunotherapy: Secondary | ICD-10-CM | POA: Diagnosis not present

## 2022-06-30 DIAGNOSIS — Z170 Estrogen receptor positive status [ER+]: Secondary | ICD-10-CM | POA: Diagnosis not present

## 2022-06-30 DIAGNOSIS — Z5111 Encounter for antineoplastic chemotherapy: Secondary | ICD-10-CM | POA: Diagnosis not present

## 2022-07-07 DIAGNOSIS — C50412 Malignant neoplasm of upper-outer quadrant of left female breast: Secondary | ICD-10-CM | POA: Diagnosis not present

## 2022-07-18 DIAGNOSIS — Z170 Estrogen receptor positive status [ER+]: Secondary | ICD-10-CM | POA: Diagnosis not present

## 2022-07-18 DIAGNOSIS — C50412 Malignant neoplasm of upper-outer quadrant of left female breast: Secondary | ICD-10-CM | POA: Diagnosis not present

## 2022-07-18 DIAGNOSIS — E039 Hypothyroidism, unspecified: Secondary | ICD-10-CM | POA: Diagnosis not present

## 2022-07-18 DIAGNOSIS — E538 Deficiency of other specified B group vitamins: Secondary | ICD-10-CM | POA: Diagnosis not present

## 2022-07-18 DIAGNOSIS — G4733 Obstructive sleep apnea (adult) (pediatric): Secondary | ICD-10-CM | POA: Diagnosis not present

## 2022-07-18 DIAGNOSIS — F419 Anxiety disorder, unspecified: Secondary | ICD-10-CM | POA: Diagnosis not present

## 2022-07-18 DIAGNOSIS — Z6834 Body mass index (BMI) 34.0-34.9, adult: Secondary | ICD-10-CM | POA: Diagnosis not present

## 2022-07-18 DIAGNOSIS — R32 Unspecified urinary incontinence: Secondary | ICD-10-CM | POA: Diagnosis not present

## 2022-07-18 DIAGNOSIS — E8881 Metabolic syndrome: Secondary | ICD-10-CM | POA: Diagnosis not present

## 2022-07-18 DIAGNOSIS — R3915 Urgency of urination: Secondary | ICD-10-CM | POA: Diagnosis not present

## 2022-07-22 DIAGNOSIS — Z170 Estrogen receptor positive status [ER+]: Secondary | ICD-10-CM | POA: Diagnosis not present

## 2022-07-22 DIAGNOSIS — C50412 Malignant neoplasm of upper-outer quadrant of left female breast: Secondary | ICD-10-CM | POA: Diagnosis not present

## 2022-07-25 DIAGNOSIS — C50912 Malignant neoplasm of unspecified site of left female breast: Secondary | ICD-10-CM | POA: Diagnosis not present

## 2022-08-04 DIAGNOSIS — C50412 Malignant neoplasm of upper-outer quadrant of left female breast: Secondary | ICD-10-CM | POA: Diagnosis not present

## 2022-08-04 DIAGNOSIS — Z79899 Other long term (current) drug therapy: Secondary | ICD-10-CM | POA: Diagnosis not present

## 2022-08-04 DIAGNOSIS — Z170 Estrogen receptor positive status [ER+]: Secondary | ICD-10-CM | POA: Diagnosis not present

## 2022-08-04 DIAGNOSIS — Z5181 Encounter for therapeutic drug level monitoring: Secondary | ICD-10-CM | POA: Diagnosis not present

## 2022-08-08 DIAGNOSIS — E8881 Metabolic syndrome: Secondary | ICD-10-CM | POA: Diagnosis not present

## 2022-08-08 DIAGNOSIS — E039 Hypothyroidism, unspecified: Secondary | ICD-10-CM | POA: Diagnosis not present

## 2022-08-08 DIAGNOSIS — E538 Deficiency of other specified B group vitamins: Secondary | ICD-10-CM | POA: Diagnosis not present

## 2022-08-08 DIAGNOSIS — G4733 Obstructive sleep apnea (adult) (pediatric): Secondary | ICD-10-CM | POA: Diagnosis not present

## 2022-08-08 DIAGNOSIS — N858 Other specified noninflammatory disorders of uterus: Secondary | ICD-10-CM | POA: Diagnosis not present

## 2022-08-08 DIAGNOSIS — N3941 Urge incontinence: Secondary | ICD-10-CM | POA: Diagnosis not present

## 2022-08-08 DIAGNOSIS — Z5112 Encounter for antineoplastic immunotherapy: Secondary | ICD-10-CM | POA: Diagnosis not present

## 2022-08-08 DIAGNOSIS — F419 Anxiety disorder, unspecified: Secondary | ICD-10-CM | POA: Diagnosis not present

## 2022-08-08 DIAGNOSIS — Z6834 Body mass index (BMI) 34.0-34.9, adult: Secondary | ICD-10-CM | POA: Diagnosis not present

## 2022-08-08 DIAGNOSIS — Z5111 Encounter for antineoplastic chemotherapy: Secondary | ICD-10-CM | POA: Diagnosis not present

## 2022-08-08 DIAGNOSIS — Z170 Estrogen receptor positive status [ER+]: Secondary | ICD-10-CM | POA: Diagnosis not present

## 2022-08-08 DIAGNOSIS — Z79899 Other long term (current) drug therapy: Secondary | ICD-10-CM | POA: Diagnosis not present

## 2022-08-08 DIAGNOSIS — C50412 Malignant neoplasm of upper-outer quadrant of left female breast: Secondary | ICD-10-CM | POA: Diagnosis not present

## 2022-08-11 DIAGNOSIS — Z170 Estrogen receptor positive status [ER+]: Secondary | ICD-10-CM | POA: Diagnosis not present

## 2022-08-11 DIAGNOSIS — C50412 Malignant neoplasm of upper-outer quadrant of left female breast: Secondary | ICD-10-CM | POA: Diagnosis not present

## 2022-08-11 DIAGNOSIS — R921 Mammographic calcification found on diagnostic imaging of breast: Secondary | ICD-10-CM | POA: Diagnosis not present

## 2022-08-21 DIAGNOSIS — C50412 Malignant neoplasm of upper-outer quadrant of left female breast: Secondary | ICD-10-CM | POA: Diagnosis not present

## 2022-09-05 DIAGNOSIS — F419 Anxiety disorder, unspecified: Secondary | ICD-10-CM | POA: Diagnosis not present

## 2022-09-05 DIAGNOSIS — E039 Hypothyroidism, unspecified: Secondary | ICD-10-CM | POA: Diagnosis not present

## 2022-09-05 DIAGNOSIS — Z5111 Encounter for antineoplastic chemotherapy: Secondary | ICD-10-CM | POA: Diagnosis not present

## 2022-09-05 DIAGNOSIS — G4733 Obstructive sleep apnea (adult) (pediatric): Secondary | ICD-10-CM | POA: Diagnosis not present

## 2022-09-05 DIAGNOSIS — Z170 Estrogen receptor positive status [ER+]: Secondary | ICD-10-CM | POA: Diagnosis not present

## 2022-09-05 DIAGNOSIS — N2889 Other specified disorders of kidney and ureter: Secondary | ICD-10-CM | POA: Diagnosis not present

## 2022-09-05 DIAGNOSIS — C50412 Malignant neoplasm of upper-outer quadrant of left female breast: Secondary | ICD-10-CM | POA: Diagnosis not present

## 2022-09-05 DIAGNOSIS — Z79899 Other long term (current) drug therapy: Secondary | ICD-10-CM | POA: Diagnosis not present

## 2022-09-05 DIAGNOSIS — Z95828 Presence of other vascular implants and grafts: Secondary | ICD-10-CM | POA: Diagnosis not present

## 2022-09-05 DIAGNOSIS — N3289 Other specified disorders of bladder: Secondary | ICD-10-CM | POA: Diagnosis not present

## 2022-09-05 DIAGNOSIS — Z5112 Encounter for antineoplastic immunotherapy: Secondary | ICD-10-CM | POA: Diagnosis not present

## 2022-09-05 DIAGNOSIS — E88819 Insulin resistance, unspecified: Secondary | ICD-10-CM | POA: Diagnosis not present

## 2022-09-05 DIAGNOSIS — N3941 Urge incontinence: Secondary | ICD-10-CM | POA: Diagnosis not present

## 2022-09-05 DIAGNOSIS — Z6834 Body mass index (BMI) 34.0-34.9, adult: Secondary | ICD-10-CM | POA: Diagnosis not present

## 2022-09-05 DIAGNOSIS — E538 Deficiency of other specified B group vitamins: Secondary | ICD-10-CM | POA: Diagnosis not present

## 2022-09-08 DIAGNOSIS — G4733 Obstructive sleep apnea (adult) (pediatric): Secondary | ICD-10-CM | POA: Diagnosis not present

## 2022-09-08 DIAGNOSIS — E039 Hypothyroidism, unspecified: Secondary | ICD-10-CM | POA: Diagnosis not present

## 2022-09-08 DIAGNOSIS — C50412 Malignant neoplasm of upper-outer quadrant of left female breast: Secondary | ICD-10-CM | POA: Diagnosis not present

## 2022-09-08 DIAGNOSIS — Z170 Estrogen receptor positive status [ER+]: Secondary | ICD-10-CM | POA: Diagnosis not present

## 2022-09-08 DIAGNOSIS — Z95828 Presence of other vascular implants and grafts: Secondary | ICD-10-CM | POA: Diagnosis not present

## 2022-09-08 DIAGNOSIS — E538 Deficiency of other specified B group vitamins: Secondary | ICD-10-CM | POA: Diagnosis not present

## 2022-09-08 DIAGNOSIS — N3289 Other specified disorders of bladder: Secondary | ICD-10-CM | POA: Diagnosis not present

## 2022-09-08 DIAGNOSIS — Z5111 Encounter for antineoplastic chemotherapy: Secondary | ICD-10-CM | POA: Diagnosis not present

## 2022-09-08 DIAGNOSIS — F419 Anxiety disorder, unspecified: Secondary | ICD-10-CM | POA: Diagnosis not present

## 2022-09-08 DIAGNOSIS — Z6834 Body mass index (BMI) 34.0-34.9, adult: Secondary | ICD-10-CM | POA: Diagnosis not present

## 2022-09-08 DIAGNOSIS — E88819 Insulin resistance, unspecified: Secondary | ICD-10-CM | POA: Diagnosis not present

## 2022-09-08 DIAGNOSIS — N3941 Urge incontinence: Secondary | ICD-10-CM | POA: Diagnosis not present

## 2022-09-08 DIAGNOSIS — Z79899 Other long term (current) drug therapy: Secondary | ICD-10-CM | POA: Diagnosis not present

## 2022-09-08 DIAGNOSIS — Z5112 Encounter for antineoplastic immunotherapy: Secondary | ICD-10-CM | POA: Diagnosis not present

## 2022-09-08 DIAGNOSIS — N2889 Other specified disorders of kidney and ureter: Secondary | ICD-10-CM | POA: Diagnosis not present

## 2022-09-23 ENCOUNTER — Other Ambulatory Visit: Payer: Self-pay | Admitting: Physician Assistant

## 2022-09-26 DIAGNOSIS — Z5112 Encounter for antineoplastic immunotherapy: Secondary | ICD-10-CM | POA: Diagnosis not present

## 2022-09-26 DIAGNOSIS — Z79899 Other long term (current) drug therapy: Secondary | ICD-10-CM | POA: Diagnosis not present

## 2022-09-26 DIAGNOSIS — C50412 Malignant neoplasm of upper-outer quadrant of left female breast: Secondary | ICD-10-CM | POA: Diagnosis not present

## 2022-09-26 DIAGNOSIS — Z170 Estrogen receptor positive status [ER+]: Secondary | ICD-10-CM | POA: Diagnosis not present

## 2022-10-14 DIAGNOSIS — C50412 Malignant neoplasm of upper-outer quadrant of left female breast: Secondary | ICD-10-CM | POA: Diagnosis not present

## 2022-10-14 DIAGNOSIS — Z170 Estrogen receptor positive status [ER+]: Secondary | ICD-10-CM | POA: Diagnosis not present

## 2022-10-17 DIAGNOSIS — Z170 Estrogen receptor positive status [ER+]: Secondary | ICD-10-CM | POA: Diagnosis not present

## 2022-10-17 DIAGNOSIS — Z5112 Encounter for antineoplastic immunotherapy: Secondary | ICD-10-CM | POA: Diagnosis not present

## 2022-10-17 DIAGNOSIS — Z79899 Other long term (current) drug therapy: Secondary | ICD-10-CM | POA: Diagnosis not present

## 2022-10-17 DIAGNOSIS — C50412 Malignant neoplasm of upper-outer quadrant of left female breast: Secondary | ICD-10-CM | POA: Diagnosis not present

## 2022-10-19 DIAGNOSIS — C50412 Malignant neoplasm of upper-outer quadrant of left female breast: Secondary | ICD-10-CM | POA: Diagnosis not present

## 2022-10-19 DIAGNOSIS — Z170 Estrogen receptor positive status [ER+]: Secondary | ICD-10-CM | POA: Diagnosis not present

## 2022-10-20 DIAGNOSIS — F32A Depression, unspecified: Secondary | ICD-10-CM | POA: Diagnosis not present

## 2022-10-20 DIAGNOSIS — Z483 Aftercare following surgery for neoplasm: Secondary | ICD-10-CM | POA: Diagnosis not present

## 2022-10-20 DIAGNOSIS — Z6835 Body mass index (BMI) 35.0-35.9, adult: Secondary | ICD-10-CM | POA: Diagnosis not present

## 2022-10-20 DIAGNOSIS — D360 Benign neoplasm of lymph nodes: Secondary | ICD-10-CM | POA: Diagnosis not present

## 2022-10-20 DIAGNOSIS — C50412 Malignant neoplasm of upper-outer quadrant of left female breast: Secondary | ICD-10-CM | POA: Diagnosis not present

## 2022-10-20 DIAGNOSIS — Z6834 Body mass index (BMI) 34.0-34.9, adult: Secondary | ICD-10-CM | POA: Diagnosis not present

## 2022-10-20 DIAGNOSIS — N62 Hypertrophy of breast: Secondary | ICD-10-CM | POA: Diagnosis not present

## 2022-10-20 DIAGNOSIS — G4730 Sleep apnea, unspecified: Secondary | ICD-10-CM | POA: Diagnosis not present

## 2022-10-20 DIAGNOSIS — N650 Deformity of reconstructed breast: Secondary | ICD-10-CM | POA: Diagnosis not present

## 2022-10-20 DIAGNOSIS — Z9221 Personal history of antineoplastic chemotherapy: Secondary | ICD-10-CM | POA: Diagnosis not present

## 2022-10-20 DIAGNOSIS — D649 Anemia, unspecified: Secondary | ICD-10-CM | POA: Diagnosis not present

## 2022-10-20 DIAGNOSIS — E669 Obesity, unspecified: Secondary | ICD-10-CM | POA: Diagnosis not present

## 2022-10-20 DIAGNOSIS — N651 Disproportion of reconstructed breast: Secondary | ICD-10-CM | POA: Diagnosis not present

## 2022-10-21 ENCOUNTER — Telehealth: Payer: Self-pay

## 2022-10-21 NOTE — Patient Outreach (Signed)
  Care Coordination TOC Note Transition Care Management Follow-up Telephone Call Date of discharge and from where: 10/20/22-[facility] Dx: breast CA" How have you been since you were released from the hospital? Spoke with patient who voices she is doing fairly well. She is dealing with current diagnosis if breast CA and recent surgery. Patient shares that she relocated over a year ago and no longer lies in [place] or sees [facility] for primary care. Patient states she has to find  new PCP and asked for instructions on how to do so. RN CM provided instructions for patient and she voiced understanding. She denies any TOC needs at this time.  Any questions or concerns? No      Care Coordination Interventions:  Education provided    Encounter Outcome:  Pt. Visit Completed    Enzo Montgomery, RN,BSN,CCM [facility] Telephonic Care Management Coordinator Direct Phone: [phone] Toll Free: [phone] Fax: [phone]

## 2022-11-07 DIAGNOSIS — Z5181 Encounter for therapeutic drug level monitoring: Secondary | ICD-10-CM | POA: Diagnosis not present

## 2022-11-07 DIAGNOSIS — R93429 Abnormal radiologic findings on diagnostic imaging of unspecified kidney: Secondary | ICD-10-CM | POA: Diagnosis not present

## 2022-11-07 DIAGNOSIS — Z170 Estrogen receptor positive status [ER+]: Secondary | ICD-10-CM | POA: Diagnosis not present

## 2022-11-07 DIAGNOSIS — Z79899 Other long term (current) drug therapy: Secondary | ICD-10-CM | POA: Diagnosis not present

## 2022-11-07 DIAGNOSIS — C50412 Malignant neoplasm of upper-outer quadrant of left female breast: Secondary | ICD-10-CM | POA: Diagnosis not present

## 2023-08-04 NOTE — Progress Notes (Signed)
treated

## 2023-11-12 IMAGING — MG MM DIGITAL DIAGNOSTIC UNILAT*L* W/ TOMO W/ CAD
8 of 14 series · 8 of 34 positions shown · non-contrast
Comparison: Previous exam(s).

CLINICAL DATA: Screening recall for a possible left breast mass
with calcifications.

EXAM:
DIGITAL DIAGNOSTIC UNILATERAL LEFT MAMMOGRAM WITH TOMOSYNTHESIS AND
CAD; ULTRASOUND LEFT BREAST LIMITED
TECHNIQUE: Left digital diagnostic mammography and breast tomosynthesis was
performed. The images were evaluated with computer-aided detection.;
Targeted ultrasound examination of the left breast was performed.

[L CC (1 of 2)]
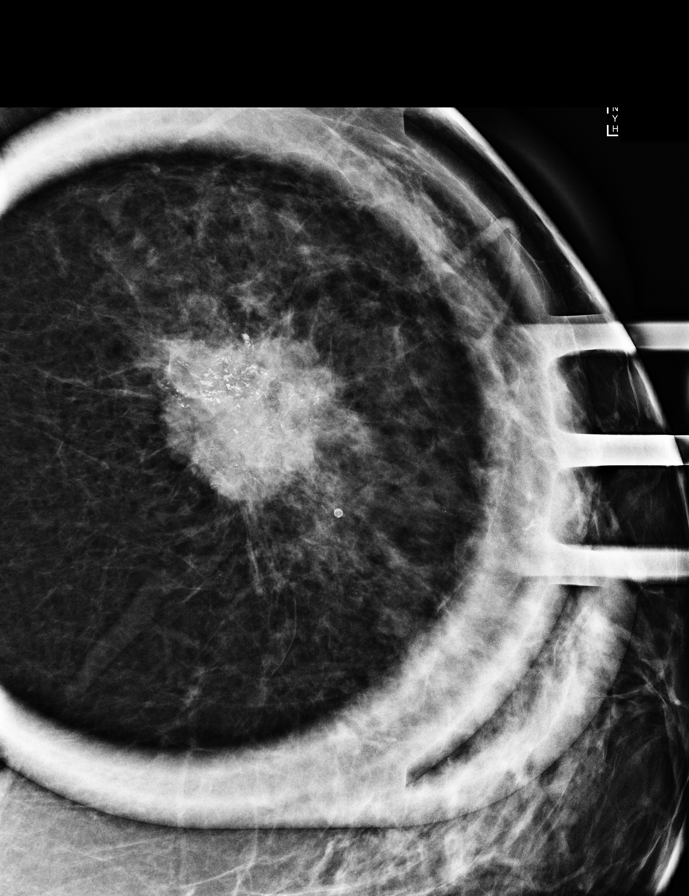

[L CC (2 of 2)]
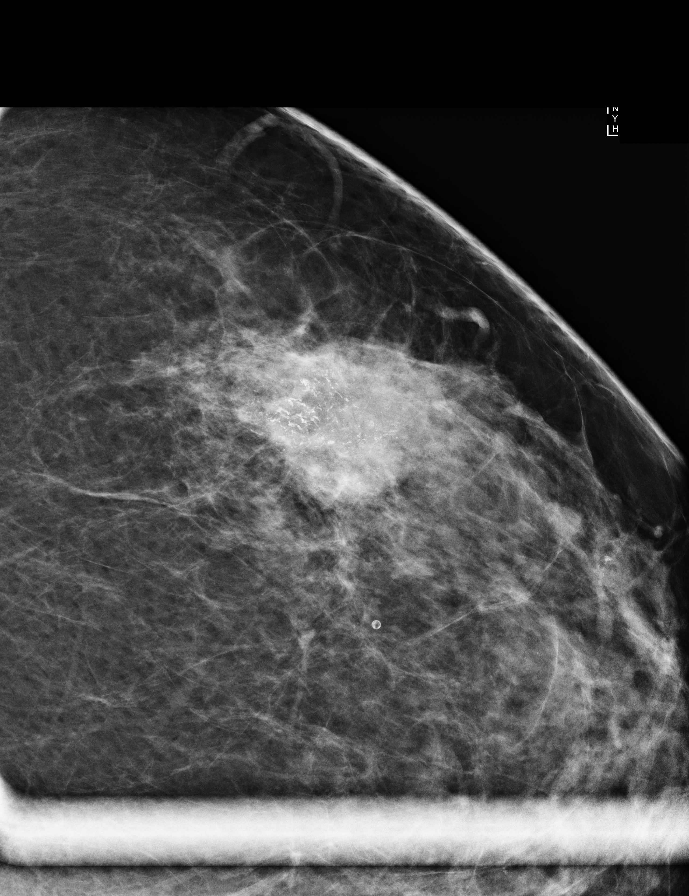

[L ML (1 of 2)]
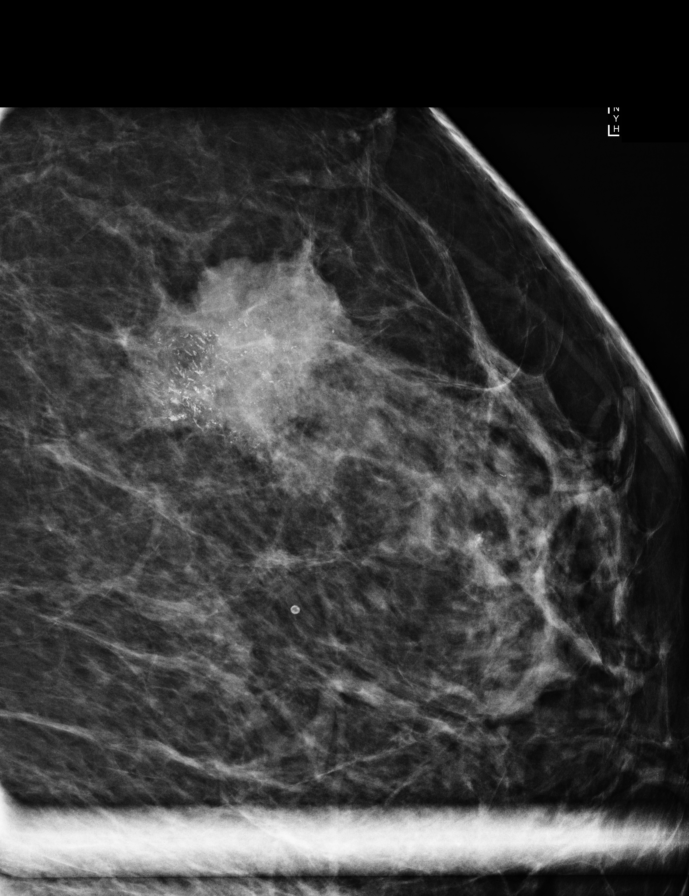

[L ML (2 of 2)]
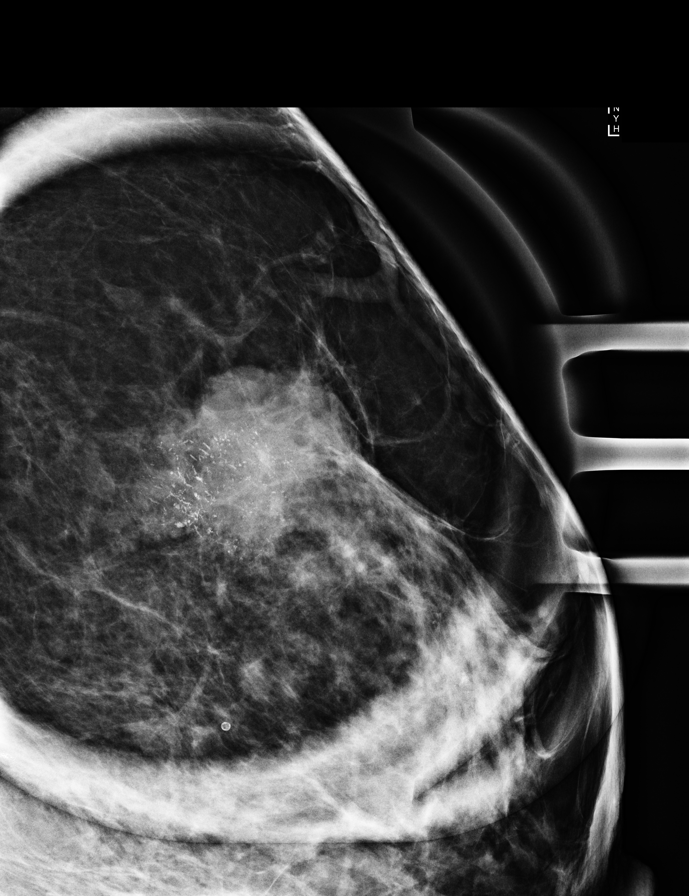

[L ML synth-2D]
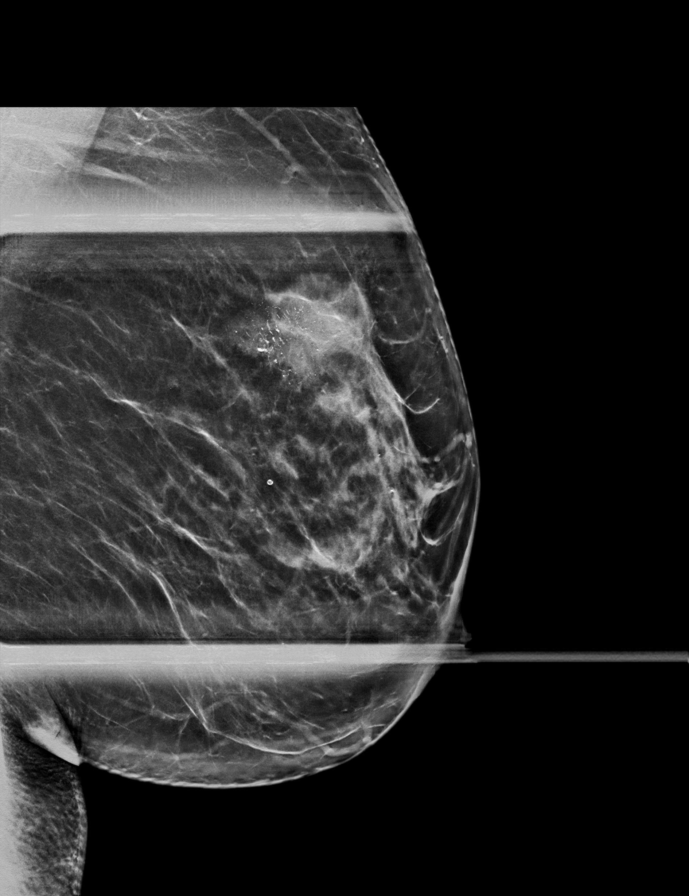

[L MLO synth-2D]
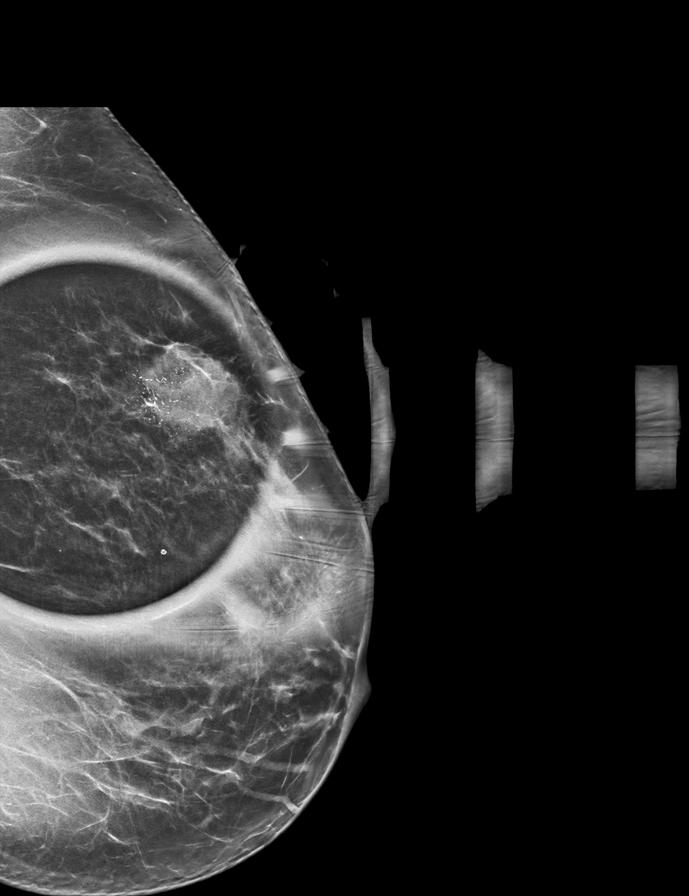

[L CC synth-2D (1 of 2)]
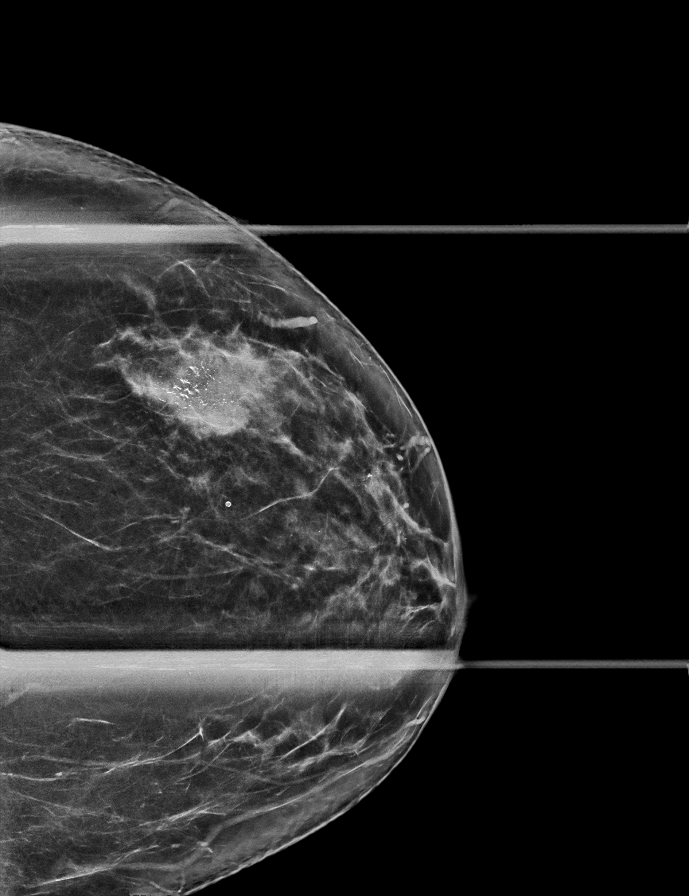

[L CC synth-2D (2 of 2)]
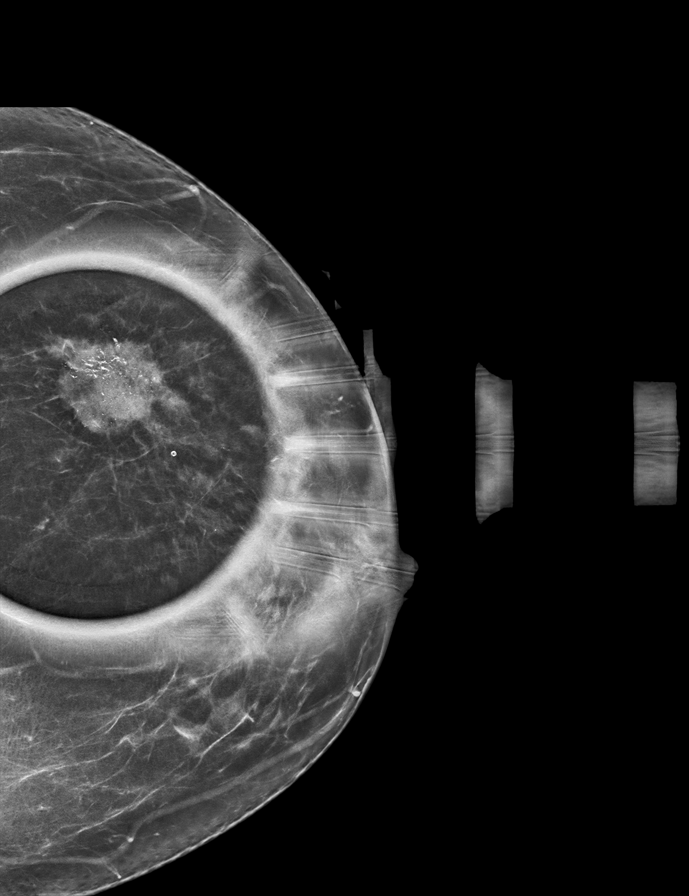

[8 of 34 positions shown; findings below may reference images not displayed]

ACR Breast Density Category b: There are scattered areas of
fibroglandular density.
FINDINGS: Spot compression tomosynthesis images through the upper outer
quadrant of the left breast demonstrates an irregular mass with
spiculated and angular margins measuring approximately 3.8 cm. There
are associated pleomorphic calcifications primarily found within the
mass. There is a 0.3 cm group of calcifications in the upper-outer
quadrant, about halfway between the dominant mass and the nipple.
There is some subtle nodular appearing breast tissue also seen
between the mass and the nipple.

Ultrasound targeted to the left breast at 2 o'clock, 6 cm from the
nipple demonstrates a hypoechoic irregular mass with indistinct
margins and echogenic internal foci compatible with calcifications.
Sonographically the mass measures approximately 2.2 x 2.0 x 2.0 cm.

Slightly closer to the nipple at 2 o'clock, 5 cm from the nipple is
a similar appearing mass measuring 0.8 x 0.4 x 0.9 cm. This mass and
the dominant mass together span 3.4 cm, which is closer to the
mammographic measurement of the mass of 3.8 cm.

The breast tissue between the nipple and these 2 suspicious masses
is heterogeneous with scattered prominent ducts, fibrocystic changes
and smaller indeterminate hypoechoic masses. One of these
hypoechoic, possibly solid masses is seen at 2 o'clock, 1 cm from
the nipple measuring 0.6 x 0.4 x 0.5 cm.

Distal to the dominant mass, there is at least 1 small hypoechoic
mass at 2 o'clock, 6 cm from the nipple. This lies 1.8 cm away from
the dominant mass. The mass itself measures 5 x 2 x 3 mm.

Extensive scanning of the left axilla demonstrates multiple
normal-appearing lymph nodes with thin cortical ribbons, and
preserved fatty hila.
IMPRESSION: 1. There is a highly suspicious mass with calcifications in the left
breast at 2 o'clock, suspicious for invasive malignancy with DCIS.
Mammographically, the mass spans 3.8 cm, which may include the 2
adjacent masses seen sonographically, which together measure 3.4 cm.

2. There is complex breast tissue between the mass and the nipple as
well as slightly distal to the mass, including dilated ducts,
fibrocystic appearing tissue and possible additional solid masses.

3. There is a 0.3 cm group of indeterminate calcifications in the
upper-outer left breast between the dominant mass and the nipple.

4.  No evidence of left axillary lymphadenopathy.

RECOMMENDATION:
1. Ultrasound guided biopsy is recommended for the dominant left
breast mass, as well as a solid-appearing mass closer to the nipple
(example at 2 o'clock, 1 cm from the nipple), and a solid-appearing
mass distal to the mass (example at 2 o'clock, 6 cm from the
nipple).

2. Following biopsy, bilateral breast MRI is suggested to delineate
the extent of disease and to determine if additional sites of biopsy
are necessary. One of these additional biopsies could include a
stereotactic biopsy of the 0.3 cm group of calcifications in the
upper-outer left breast.

3. The patient has recently relocated to [place] and will
ultimately want to set up her care closer to home. I recommended
that she take a hard copy of all images and reports with her once
all imaging and biopsies are complete.

I have discussed the findings and recommendations with the patient.
If applicable, a reminder letter will be sent to the patient
regarding the next appointment.

BI-RADS CATEGORY  5: Highly suggestive of malignancy.

## 2023-11-12 IMAGING — US US BREAST*L* LIMITED INC AXILLA
1 series · 12 of 25 positions shown · non-contrast
Comparison: Previous exam(s).

CLINICAL DATA: Screening recall for a possible left breast mass
with calcifications.

EXAM:
DIGITAL DIAGNOSTIC UNILATERAL LEFT MAMMOGRAM WITH TOMOSYNTHESIS AND
CAD; ULTRASOUND LEFT BREAST LIMITED
TECHNIQUE: Left digital diagnostic mammography and breast tomosynthesis was
performed. The images were evaluated with computer-aided detection.;
Targeted ultrasound examination of the left breast was performed.

[Series 1: us breast*left* limited inc axilla · 0.07mm/px · 12 of 36 slices shown]
[im 2/36]
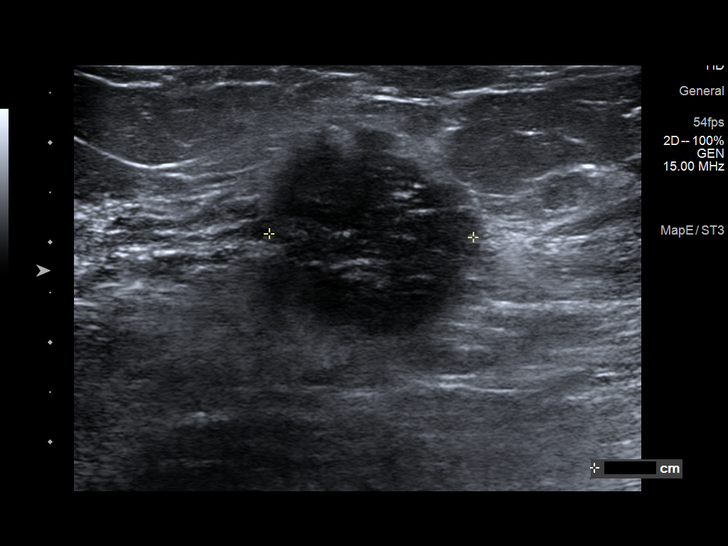
[im 5/36]
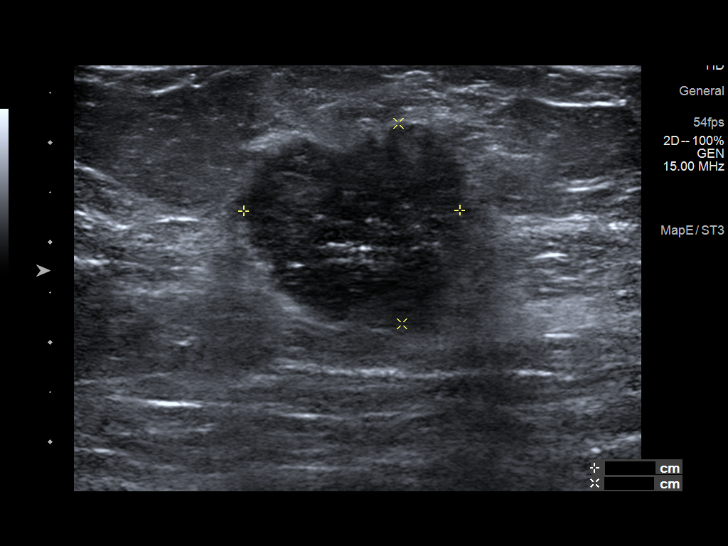
[im 8/36]
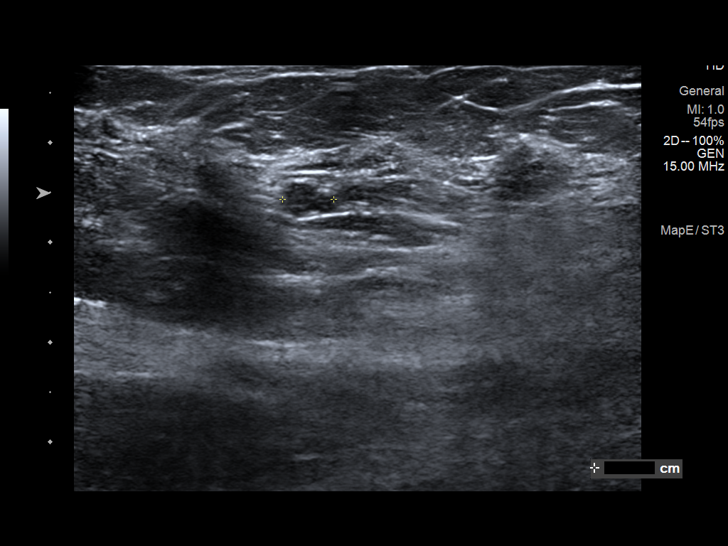
[im 11/36]
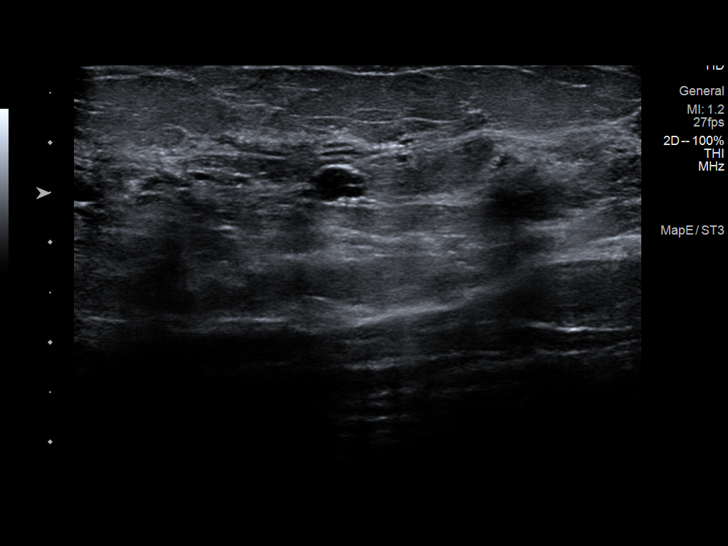
[im 14/36]
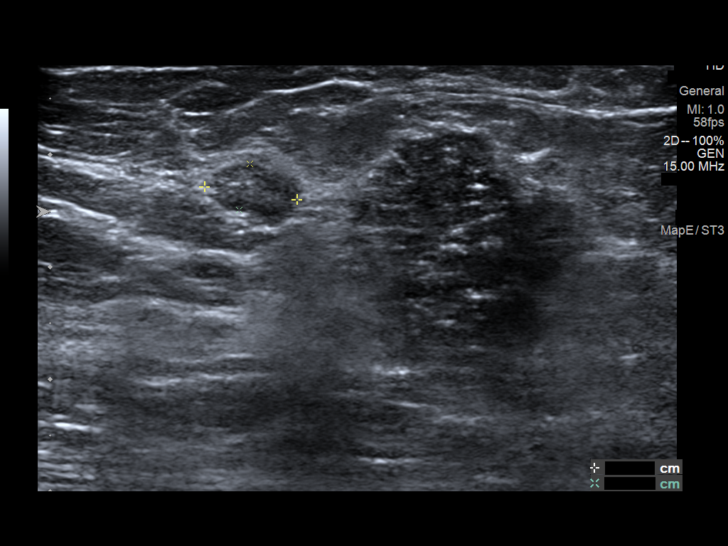
[im 17/36]
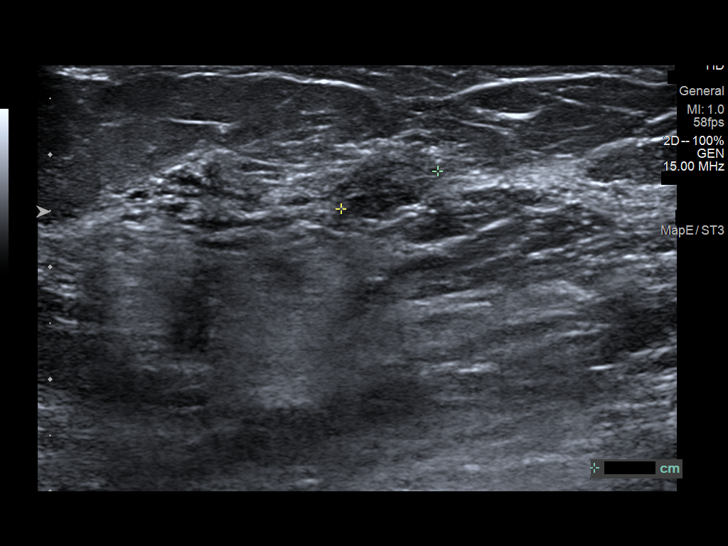
[im 19/36]
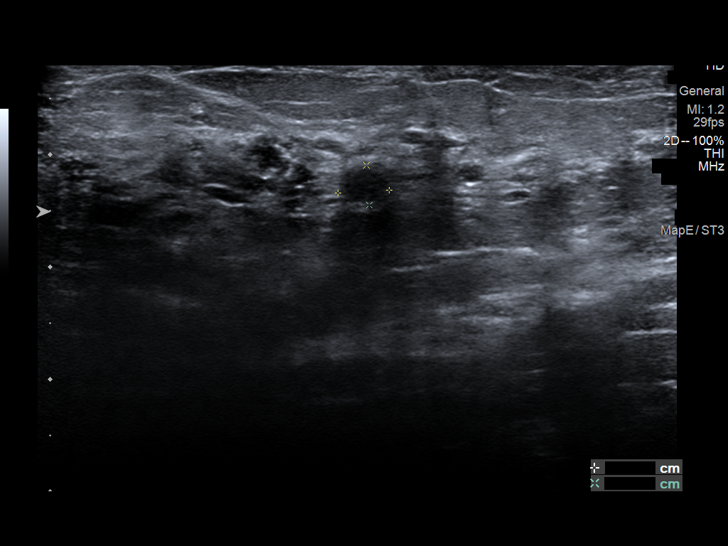
[im 22/36]
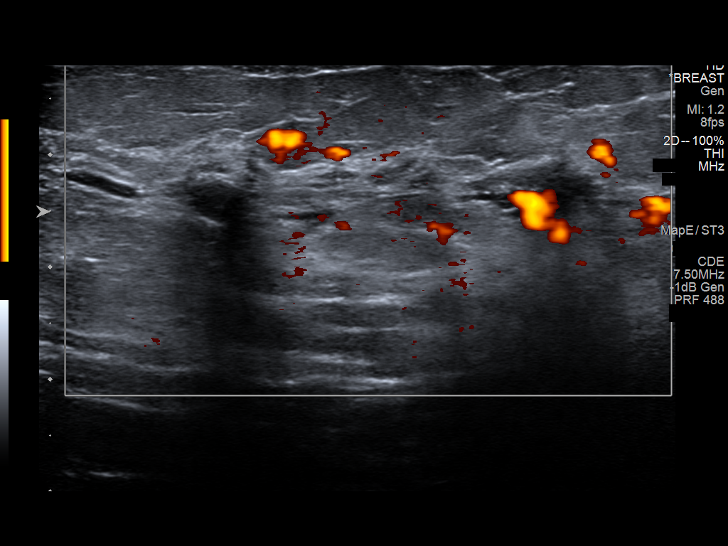
[im 25/36]
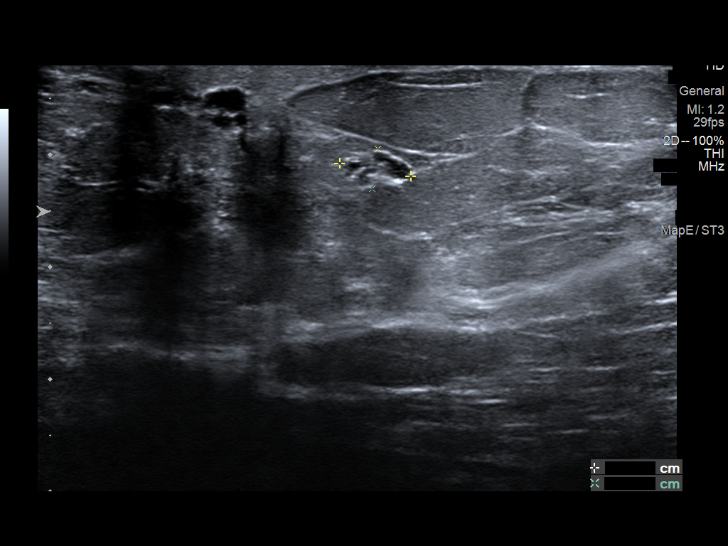
[im 28/36]
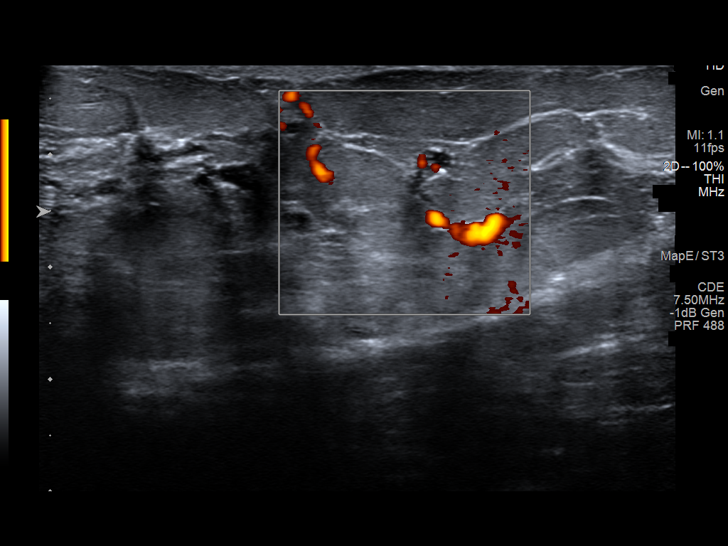
[im 31/36]
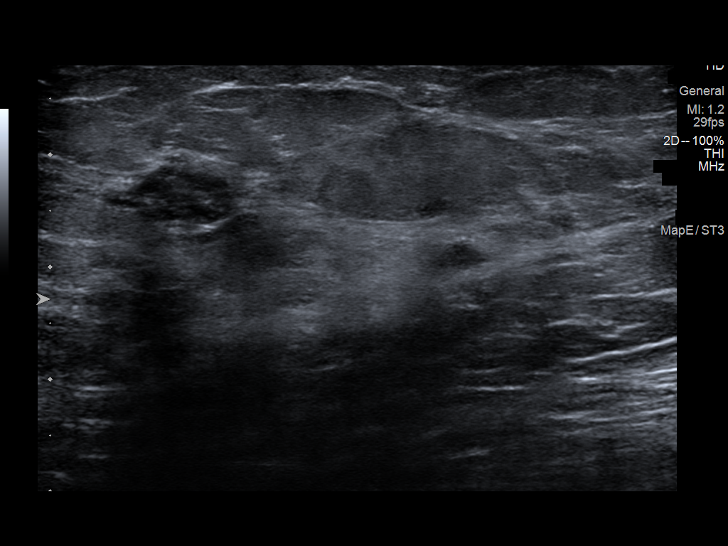
[im 34/36]
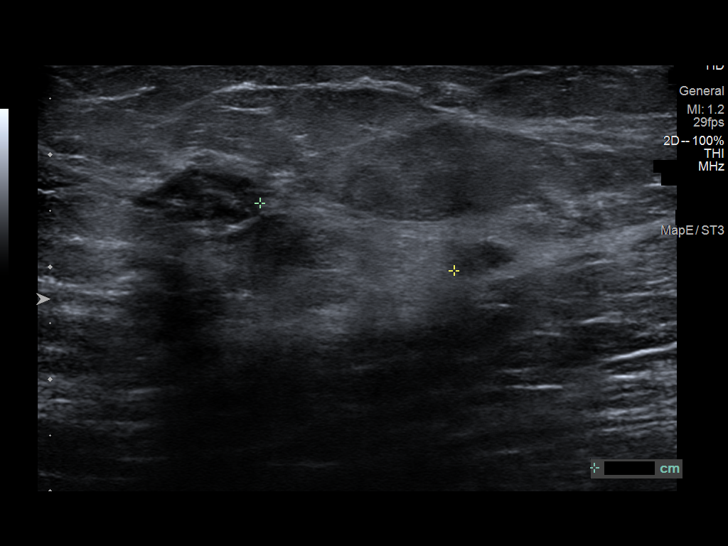

[12 of 25 positions shown; findings below may reference images not displayed]

ACR Breast Density Category b: There are scattered areas of
fibroglandular density.
FINDINGS: Spot compression tomosynthesis images through the upper outer
quadrant of the left breast demonstrates an irregular mass with
spiculated and angular margins measuring approximately 3.8 cm. There
are associated pleomorphic calcifications primarily found within the
mass. There is a 0.3 cm group of calcifications in the upper-outer
quadrant, about halfway between the dominant mass and the nipple.
There is some subtle nodular appearing breast tissue also seen
between the mass and the nipple.

Ultrasound targeted to the left breast at 2 o'clock, 6 cm from the
nipple demonstrates a hypoechoic irregular mass with indistinct
margins and echogenic internal foci compatible with calcifications.
Sonographically the mass measures approximately 2.2 x 2.0 x 2.0 cm.

Slightly closer to the nipple at 2 o'clock, 5 cm from the nipple is
a similar appearing mass measuring 0.8 x 0.4 x 0.9 cm. This mass and
the dominant mass together span 3.4 cm, which is closer to the
mammographic measurement of the mass of 3.8 cm.

The breast tissue between the nipple and these 2 suspicious masses
is heterogeneous with scattered prominent ducts, fibrocystic changes
and smaller indeterminate hypoechoic masses. One of these
hypoechoic, possibly solid masses is seen at 2 o'clock, 1 cm from
the nipple measuring 0.6 x 0.4 x 0.5 cm.

Distal to the dominant mass, there is at least 1 small hypoechoic
mass at 2 o'clock, 6 cm from the nipple. This lies 1.8 cm away from
the dominant mass. The mass itself measures 5 x 2 x 3 mm.

Extensive scanning of the left axilla demonstrates multiple
normal-appearing lymph nodes with thin cortical ribbons, and
preserved fatty hila.
IMPRESSION: 1. There is a highly suspicious mass with calcifications in the left
breast at 2 o'clock, suspicious for invasive malignancy with DCIS.
Mammographically, the mass spans 3.8 cm, which may include the 2
adjacent masses seen sonographically, which together measure 3.4 cm.

2. There is complex breast tissue between the mass and the nipple as
well as slightly distal to the mass, including dilated ducts,
fibrocystic appearing tissue and possible additional solid masses.

3. There is a 0.3 cm group of indeterminate calcifications in the
upper-outer left breast between the dominant mass and the nipple.

4.  No evidence of left axillary lymphadenopathy.

RECOMMENDATION:
1. Ultrasound guided biopsy is recommended for the dominant left
breast mass, as well as a solid-appearing mass closer to the nipple
(example at 2 o'clock, 1 cm from the nipple), and a solid-appearing
mass distal to the mass (example at 2 o'clock, 6 cm from the
nipple).

2. Following biopsy, bilateral breast MRI is suggested to delineate
the extent of disease and to determine if additional sites of biopsy
are necessary. One of these additional biopsies could include a
stereotactic biopsy of the 0.3 cm group of calcifications in the
upper-outer left breast.

3. The patient has recently relocated to [place] and will
ultimately want to set up her care closer to home. I recommended
that she take a hard copy of all images and reports with her once
all imaging and biopsies are complete.

I have discussed the findings and recommendations with the patient.
If applicable, a reminder letter will be sent to the patient
regarding the next appointment.

BI-RADS CATEGORY  5: Highly suggestive of malignancy.

## 2024-04-28 NOTE — Progress Notes (Signed)
 error
